# Patient Record
Sex: Female | Born: 1960 | Hispanic: No | Smoking: Never smoker
Health system: Southern US, Community
[De-identification: ages and names within clinical notes are randomized; demographics above are authoritative.]

## PROBLEM LIST (undated history)

## (undated) DIAGNOSIS — K219 Gastro-esophageal reflux disease without esophagitis: Secondary | ICD-10-CM

## (undated) DIAGNOSIS — E669 Obesity, unspecified: Secondary | ICD-10-CM

## (undated) DIAGNOSIS — R030 Elevated blood-pressure reading, without diagnosis of hypertension: Secondary | ICD-10-CM

## (undated) DIAGNOSIS — E039 Hypothyroidism, unspecified: Secondary | ICD-10-CM

## (undated) HISTORY — DX: Gastro-esophageal reflux disease without esophagitis: K21.9

## (undated) HISTORY — DX: Elevated blood-pressure reading, without diagnosis of hypertension: R03.0

---

## 1991-05-06 HISTORY — PX: TUBAL LIGATION: SHX77

## 2003-10-03 ENCOUNTER — Inpatient Hospital Stay (HOSPITAL_COMMUNITY): Admission: AD | Admit: 2003-10-03 | Discharge: 2003-10-03 | Payer: Self-pay | Admitting: *Deleted

## 2003-10-05 ENCOUNTER — Inpatient Hospital Stay (HOSPITAL_COMMUNITY): Admission: AD | Admit: 2003-10-05 | Discharge: 2003-10-05 | Payer: Self-pay | Admitting: Obstetrics & Gynecology

## 2003-11-01 ENCOUNTER — Inpatient Hospital Stay (HOSPITAL_COMMUNITY): Admission: AD | Admit: 2003-11-01 | Discharge: 2003-11-01 | Payer: Self-pay | Admitting: Obstetrics and Gynecology

## 2003-11-02 ENCOUNTER — Encounter: Admission: RE | Admit: 2003-11-02 | Discharge: 2003-11-02 | Payer: Self-pay | Admitting: Obstetrics and Gynecology

## 2003-11-07 ENCOUNTER — Ambulatory Visit (HOSPITAL_COMMUNITY): Admission: RE | Admit: 2003-11-07 | Discharge: 2003-11-07 | Payer: Self-pay | Admitting: *Deleted

## 2003-12-19 ENCOUNTER — Encounter: Admission: RE | Admit: 2003-12-19 | Discharge: 2003-12-19 | Payer: Self-pay | Admitting: Obstetrics and Gynecology

## 2009-02-05 ENCOUNTER — Emergency Department (HOSPITAL_COMMUNITY): Admission: EM | Admit: 2009-02-05 | Discharge: 2009-02-05 | Payer: Self-pay | Admitting: Emergency Medicine

## 2010-01-02 ENCOUNTER — Emergency Department (HOSPITAL_COMMUNITY): Admission: EM | Admit: 2010-01-02 | Discharge: 2010-01-02 | Payer: Self-pay | Admitting: Emergency Medicine

## 2010-04-30 ENCOUNTER — Emergency Department (HOSPITAL_BASED_OUTPATIENT_CLINIC_OR_DEPARTMENT_OTHER)
Admission: EM | Admit: 2010-04-30 | Discharge: 2010-04-30 | Payer: Self-pay | Source: Home / Self Care | Admitting: Emergency Medicine

## 2010-05-12 ENCOUNTER — Emergency Department (HOSPITAL_BASED_OUTPATIENT_CLINIC_OR_DEPARTMENT_OTHER)
Admission: EM | Admit: 2010-05-12 | Discharge: 2010-05-12 | Payer: Self-pay | Source: Home / Self Care | Admitting: Emergency Medicine

## 2010-07-19 LAB — CBC
HCT: 37.6 % (ref 36.0–46.0)
Hemoglobin: 13.1 g/dL (ref 12.0–15.0)
MCHC: 34.8 g/dL (ref 30.0–36.0)
MCV: 84.9 fL (ref 78.0–100.0)
Platelets: 202 10*3/uL (ref 150–400)
RBC: 4.43 MIL/uL (ref 3.87–5.11)
RDW: 13.8 % (ref 11.5–15.5)
WBC: 9.1 10*3/uL (ref 4.0–10.5)

## 2010-07-19 LAB — DIFFERENTIAL
Basophils Absolute: 0 10*3/uL (ref 0.0–0.1)
Basophils Relative: 0 % (ref 0–1)
Eosinophils Absolute: 0.1 10*3/uL (ref 0.0–0.7)
Eosinophils Relative: 1 % (ref 0–5)
Lymphocytes Relative: 21 % (ref 12–46)
Lymphs Abs: 1.9 10*3/uL (ref 0.7–4.0)
Monocytes Relative: 13 % — ABNORMAL HIGH (ref 3–12)
Neutrophils Relative %: 64 % (ref 43–77)

## 2010-07-19 LAB — COMPREHENSIVE METABOLIC PANEL
ALT: 18 U/L (ref 0–35)
AST: 23 U/L (ref 0–37)
Albumin: 3.7 g/dL (ref 3.5–5.2)
Alkaline Phosphatase: 75 U/L (ref 39–117)
BUN: 7 mg/dL (ref 6–23)
CO2: 28 mEq/L (ref 19–32)
Chloride: 110 mEq/L (ref 96–112)
Creatinine, Ser: 0.81 mg/dL (ref 0.4–1.2)
GFR calc Af Amer: >60 mL/min (ref >60)
GFR calc non Af Amer: >60 mL/min (ref >60)
Glucose, Bld: 96 mg/dL (ref 70–99)
Potassium: 4 mEq/L (ref 3.5–5.1)
Sodium: 142 mEq/L (ref 135–145)

## 2010-07-19 LAB — POCT CARDIAC MARKERS
CKMB, poc: <1 ng/mL — ABNORMAL LOW (ref 1.0–8.0)
Myoglobin, poc: 46.6 ng/mL (ref 12–200)
Troponin i, poc: <0.05 ng/mL (ref 0.00–0.09)

## 2010-07-19 LAB — GLUCOSE, CAPILLARY: Glucose-Capillary: 105 mg/dL — ABNORMAL HIGH (ref 70–99)

## 2013-04-15 ENCOUNTER — Ambulatory Visit: Payer: Self-pay

## 2013-05-09 ENCOUNTER — Ambulatory Visit: Payer: Self-pay

## 2013-07-12 ENCOUNTER — Ambulatory Visit: Payer: Self-pay

## 2013-07-22 ENCOUNTER — Ambulatory Visit: Payer: No Typology Code available for payment source | Attending: Internal Medicine

## 2013-08-19 ENCOUNTER — Ambulatory Visit: Payer: Self-pay | Admitting: Internal Medicine

## 2013-12-07 ENCOUNTER — Ambulatory Visit: Payer: No Typology Code available for payment source | Attending: Internal Medicine | Admitting: Internal Medicine

## 2013-12-07 ENCOUNTER — Encounter: Payer: Self-pay | Admitting: Internal Medicine

## 2013-12-07 VITALS — BP 138/90 | HR 70 | Temp 98.5°F | Resp 16 | Wt 206.0 lb

## 2013-12-07 DIAGNOSIS — S025XXK Fracture of tooth (traumatic), subsequent encounter for fracture with nonunion: Secondary | ICD-10-CM

## 2013-12-07 DIAGNOSIS — Y929 Unspecified place or not applicable: Secondary | ICD-10-CM | POA: Insufficient documentation

## 2013-12-07 DIAGNOSIS — S025XXA Fracture of tooth (traumatic), initial encounter for closed fracture: Secondary | ICD-10-CM | POA: Insufficient documentation

## 2013-12-07 DIAGNOSIS — X58XXXA Exposure to other specified factors, initial encounter: Secondary | ICD-10-CM | POA: Insufficient documentation

## 2013-12-07 DIAGNOSIS — IMO0002 Reserved for concepts with insufficient information to code with codable children: Secondary | ICD-10-CM

## 2013-12-07 DIAGNOSIS — Z9109 Other allergy status, other than to drugs and biological substances: Secondary | ICD-10-CM

## 2013-12-07 DIAGNOSIS — H0019 Chalazion unspecified eye, unspecified eyelid: Secondary | ICD-10-CM

## 2013-12-07 DIAGNOSIS — M25512 Pain in left shoulder: Secondary | ICD-10-CM | POA: Insufficient documentation

## 2013-12-07 DIAGNOSIS — Z1211 Encounter for screening for malignant neoplasm of colon: Secondary | ICD-10-CM

## 2013-12-07 DIAGNOSIS — M25519 Pain in unspecified shoulder: Secondary | ICD-10-CM

## 2013-12-07 DIAGNOSIS — K029 Dental caries, unspecified: Secondary | ICD-10-CM | POA: Insufficient documentation

## 2013-12-07 DIAGNOSIS — H0013 Chalazion right eye, unspecified eyelid: Secondary | ICD-10-CM | POA: Insufficient documentation

## 2013-12-07 DIAGNOSIS — Z139 Encounter for screening, unspecified: Secondary | ICD-10-CM

## 2013-12-07 MED ORDER — CETIRIZINE HCL 10 MG PO TABS: 10.0000 mg | ORAL_TABLET | Freq: Every day | ORAL | Status: DC

## 2013-12-07 MED ORDER — IBUPROFEN 800 MG PO TABS: 800.0000 mg | ORAL_TABLET | Freq: Three times a day (TID) | ORAL | Status: DC | PRN

## 2013-12-07 NOTE — Progress Notes (Signed)
Patient Demographics  Brandi [name], is a 53 y.o. female  BJY:[id number]  YQM:[id number]  DOB - 28-Dec-1960  CC:  Chief Complaint  Patient presents with  . Eye Pain    lump on right eye lid       HPI: Brandi Champagneetrona Luczynski is a 53 y.o. female here today to establish medical care. Patient reported to have noted in swelling and nodularity on her right eye for the last 3 months, she denies any pain or discharge, she has tried warm compress but it has not improved, denies any change in vision, she is also requesting referral to see a dentist has lot of dental cavities and broken tooth, also complaining of left on pain for several months denies any fall or trauma, she has not tried any pain medication also has lot of an omental allergies. Patient has No headache, No chest pain, No abdominal pain - No Nausea, No new weakness tingling or numbness, No Cough - SOB.  No Known Allergies History reviewed. No pertinent past medical history. No current outpatient prescriptions on file prior to visit.   No current facility-administered medications on file prior to visit.   Family History  Problem Relation Age of Onset  . Diabetes Mother   . Stroke Mother   . Hypertension Mother   . Cancer Maternal Aunt    History   Social History  . Marital Status: Married    Spouse Name: N/A    Number of Children: N/A  . Years of Education: N/A   Occupational History  . Not on file.   Social History Main Topics  . Smoking status: Never Smoker   . Smokeless tobacco: Not on file  . Alcohol Use: No  . Drug Use: Not on file  . Sexual Activity: Not on file   Other Topics Concern  . Not on file   Social History Narrative  . No narrative on file    Review of Systems: Constitutional: Negative for fever, chills, diaphoresis, activity change, appetite change and fatigue. HENT: Negative for ear pain, nosebleeds, congestion, facial swelling, rhinorrhea, neck pain, neck stiffness and ear  discharge.  Eyes: Negative for pain, discharge, redness, itching and visual disturbance. Respiratory: Negative for cough, choking, chest tightness, shortness of breath, wheezing and stridor.  Cardiovascular: Negative for chest pain, palpitations and leg swelling. Gastrointestinal: Negative for abdominal distention. Genitourinary: Negative for dysuria, urgency, frequency, hematuria, flank pain, decreased urine volume, difficulty urinating and dyspareunia.  Musculoskeletal: Negative for back pain, joint swelling, arthralgia and gait problem. Neurological: Negative for dizziness, tremors, seizures, syncope, facial asymmetry, speech difficulty, weakness, light-headedness, numbness and headaches.  Hematological: Negative for adenopathy. Does not bruise/bleed easily. Psychiatric/Behavioral: Negative for hallucinations, behavioral problems, confusion, dysphoric mood, decreased concentration and agitation.    Objective:   Filed Vitals:   12/07/13 1631  BP: 138/90  Pulse: 70  Temp: 98.5 F (36.9 C)  Resp: 16    Physical Exam: Constitutional: Patient appears well-developed and well-nourished. No distress. HENT: Normocephalic, atraumatic, External right and left ear normal. Oropharynx is clear and moist.  Eyes: Conjunctivae and EOM are normal. PERRLA, no scleral icterus. Right eye  localized swelling nontender no apparent discharge. Neck: Normal ROM. Neck supple. No JVD. No tracheal deviation. No thyromegaly. CVS: RRR, S1/S2 +, no murmurs, no gallops, no carotid bruit.  Pulmonary: Effort and breath sounds normal, no stridor, rhonchi, wheezes, rales.  Abdominal: Soft. BS +, no distension, tenderness, rebound or guarding.  Musculoskeletal: Normal range of motion. Tenderness on  the left shoulder anteriorly, some limitation in full range of motion posteriorly.  Neuro: Alert. Normal reflexes, muscle tone coordination. No cranial nerve deficit. Skin: Skin is warm and dry. No rash noted. Not  diaphoretic. No erythema. No pallor. Psychiatric: Normal mood and affect. Behavior, judgment, thought content normal.  Lab Results  Component Value Date   WBC 9.1 01/02/2010   HGB 13.1 01/02/2010   HCT 37.6 01/02/2010   MCV 84.9 01/02/2010   PLT 202 01/02/2010   Lab Results  Component Value Date   CREATININE 0.81 01/02/2010   BUN 7 01/02/2010   NA 142 01/02/2010   K 4.0 01/02/2010   CL 110 01/02/2010   CO2 28 01/02/2010    No results found for this basename: HGBA1C   Lipid Panel  No results found for this basename: chol, trig, hdl, cholhdl, vldl, ldlcalc       Assessment and plan:   1. Broken tooth, with nonunion, subsequent encounter  - Ambulatory referral to Dentistry  2. Chalazion of right eye Patient is given information and she will continue with warm compress since it has been persistent for the last 3 months I have given her referral to see ophthalmology. - Ambulatory referral to Ophthalmology  3. Screening We'll do baseline blood work - CBC with Differential - TSH - Lipid panel - Vit D  25 hydroxy (rtn osteoporosis monitoring) - COMPLETE METABOLIC PANEL WITH GFR - MM DIGITAL SCREENING BILATERAL; Future  4. Special screening for malignant neoplasms, colon  - Ambulatory referral to Gastroenterology  5. Left shoulder pain I ordered an x-ray and prescribed ibuprofen when necessary - ibuprofen (ADVIL,MOTRIN) 800 MG tablet; Take 1 tablet (800 mg total) by mouth every 8 (eight) hours as needed.  Dispense: 30 tablet; Refill: 1 - DG Shoulder Left; Future  6. Environmental allergies  - cetirizine (ZYRTEC) 10 MG tablet; Take 1 tablet (10 mg total) by mouth daily.  Dispense: 30 tablet; Refill: 3        Health Maintenance -Colonoscopy: referred to GI  -Mammogram: ordered    Return in about 3 months (around 03/09/2014).    Doris Cheadle, MD

## 2013-12-07 NOTE — Progress Notes (Signed)
Patient here with in house interpreter States has not been to see a doctor in a very long time Concerned about small lump on her right eye lid that has Been there since April of this year Patient will also need a referral to the dentist

## 2013-12-07 NOTE — Patient Instructions (Addendum)
Chalazin  (Chalazion) Un chalazin es una hinchazn o bulto duro en el prpado originado en la obstruccin de una glndula sebcea. Puede ocurrir en el prpado superior o en el inferior.  CAUSAS  Obstruccin de una glndula sebcea del prpado.  SNTOMAS   Hinchazn o bulto duro en el prpado. El bulto puede dificultarle la visin.  La hinchazn puede extenderse a zonas que rodean el ojo. TRATAMIENTO   Aunque en algunos casos desaparecen por s mismos en 1  2 meses, en otros casos deben extirparse.  Podr necesitar medicamentos para tratar la infeccin. INSTRUCCIONES PARA EL CUIDADO EN EL HOGAR   Lave sus manos con frecuencia y squelas con una toalla limpia. No toque el chalazin.  Aplique calor sobre el prpado varias veces por da durante 10 minutos para calmar las molestias y Air traffic controllerfavorecer la salida del lquido blanco amarillento (pus) a la superficie. Una forma de Corporate treasureraplicar calor es usar una cuchara de metal.  Sostenga el mango debajo del agua caliente hasta que tome calor y luego envulvalo en toallas de papel, de modo que el calor llegue sin quemar la piel.  Sostenga el mango envuelto en papel contra el chalazin y vuelva a calentarlo cuando lo necesite.  Aplique calor durante 10 minutos, 4 veces por da.  Concurra al mdico para que le retire el pus, si no se abre ruptura  por s mismo.  No trate de extraer usted mismo el pus apretando el chalazin o pinchndolo con un alfiler o una aguja.  Slo tome medicamentos de venta libre o recetados para Primary school teachercalmar el dolor, las molestias o bajar la fiebre segn las indicaciones de su mdico. SOLICITE ATENCIN MDICA DE INMEDIATO SI:   Siente dolor en el abdomen.  Hay cambios en la visin.  El dolor persiste.  El chalazin se torna doloroso, rojo o hinchado, se agranda y no empieza a Journalist, newspaperdesaparecer despus de 2 semanas. ASEGRESE DE QUE:   Comprende estas instrucciones.  Controlar su enfermedad.  Solicitar ayuda de inmediato si no  mejora o si empeora. Document Released: 04/21/2005 Document Revised: 10/21/2011 [facility] Patient Information 2015 [employer], [place]. This information is not intended to replace advice given to you by your health care provider. Make sure you discuss any questions you have with your health care provider.

## 2013-12-08 LAB — CBC WITH DIFFERENTIAL/PLATELET
Basophils Absolute: 0 10*3/uL (ref 0.0–0.1)
Basophils Relative: 0 % (ref 0–1)
EOS PCT: 1 % (ref 0–5)
Eosinophils Absolute: 0.1 10*3/uL (ref 0.0–0.7)
HCT: 39.3 % (ref 36.0–46.0)
Hemoglobin: 12.9 g/dL (ref 12.0–15.0)
LYMPHS ABS: 1.8 10*3/uL (ref 0.7–4.0)
Lymphocytes Relative: 19 % (ref 12–46)
MCH: 28.2 pg (ref 26.0–34.0)
MCHC: 32.8 g/dL (ref 30.0–36.0)
MCV: 86 fL (ref 78.0–100.0)
Monocytes Absolute: 1.5 10*3/uL — ABNORMAL HIGH (ref 0.1–1.0)
Monocytes Relative: 16 % — ABNORMAL HIGH (ref 3–12)
Neutro Abs: 6 10*3/uL (ref 1.7–7.7)
Neutrophils Relative %: 64 % (ref 43–77)
Platelets: 274 10*3/uL (ref 150–400)
RBC: 4.57 MIL/uL (ref 3.87–5.11)
RDW: 14.6 % (ref 11.5–15.5)
WBC: 9.4 10*3/uL (ref 4.0–10.5)

## 2013-12-08 LAB — COMPLETE METABOLIC PANEL WITH GFR
ALT: 25 U/L (ref 0–35)
AST: 24 U/L (ref 0–37)
Albumin: 4.2 g/dL (ref 3.5–5.2)
Alkaline Phosphatase: 91 U/L (ref 39–117)
BUN: 9 mg/dL (ref 6–23)
CO2: 26 mEq/L (ref 19–32)
CREATININE: 0.7 mg/dL (ref 0.50–1.10)
Calcium: 9.5 mg/dL (ref 8.4–10.5)
Chloride: 106 mEq/L (ref 96–112)
GFR, Est African American: >89 mL/min
GFR, Est Non African American: >89 mL/min
Glucose, Bld: 81 mg/dL (ref 70–99)
Potassium: 4.4 mEq/L (ref 3.5–5.3)
Sodium: 139 mEq/L (ref 135–145)
Total Bilirubin: 0.6 mg/dL (ref 0.2–1.2)
Total Protein: 8 g/dL (ref 6.0–8.3)

## 2013-12-08 LAB — LIPID PANEL
Cholesterol: 170 mg/dL (ref 0–200)
HDL: 42 mg/dL (ref >39)
LDL Cholesterol: 54 mg/dL (ref 0–99)
Total CHOL/HDL Ratio: 4 Ratio
Triglycerides: 371 mg/dL — ABNORMAL HIGH (ref <150)
VLDL: 74 mg/dL — ABNORMAL HIGH (ref 0–40)

## 2013-12-08 LAB — TSH: TSH: 3.481 u[IU]/mL (ref 0.350–4.500)

## 2013-12-08 LAB — VITAMIN D 25 HYDROXY (VIT D DEFICIENCY, FRACTURES): Vit D, 25-Hydroxy: 33 ng/mL (ref 30–89)

## 2013-12-09 ENCOUNTER — Telehealth: Payer: Self-pay | Admitting: *Deleted

## 2013-12-09 NOTE — Telephone Encounter (Signed)
Pt is aware of her lab results.  

## 2013-12-09 NOTE — Telephone Encounter (Signed)
Message copied by Raynelle CharyWINFREE, Casy Tavano R on Fri Dec 09, 2013  9:33 AM ------      Message from: Doris CheadleADVANI, DEEPAK      Created: Thu Dec 08, 2013 10:02 AM       Blood work reviewed, noticed elevated triglycerides, advise patient for low fat diet.        ------

## 2013-12-20 ENCOUNTER — Telehealth: Payer: Self-pay

## 2013-12-20 NOTE — Telephone Encounter (Signed)
Message copied by Lestine MountJUAREZ, Kasen Adduci L on Tue Dec 20, 2013  2:28 PM ------      Message from: Doris CheadleADVANI, DEEPAK      Created: Thu Dec 08, 2013 10:02 AM       Blood work reviewed, noticed elevated triglycerides, advise patient for low fat diet.        ------

## 2015-02-20 ENCOUNTER — Ambulatory Visit: Payer: Self-pay

## 2015-02-28 ENCOUNTER — Encounter (HOSPITAL_COMMUNITY): Payer: Self-pay | Admitting: *Deleted

## 2015-02-28 ENCOUNTER — Emergency Department (HOSPITAL_COMMUNITY)
Admission: EM | Admit: 2015-02-28 | Discharge: 2015-02-28 | Disposition: A | Discharge status: Home / Self Care | Payer: Self-pay | Attending: Physician Assistant | Admitting: Physician Assistant

## 2015-02-28 DIAGNOSIS — Z3202 Encounter for pregnancy test, result negative: Secondary | ICD-10-CM | POA: Insufficient documentation

## 2015-02-28 DIAGNOSIS — K2970 Gastritis, unspecified, without bleeding: Secondary | ICD-10-CM

## 2015-02-28 DIAGNOSIS — E669 Obesity, unspecified: Secondary | ICD-10-CM | POA: Insufficient documentation

## 2015-02-28 HISTORY — DX: Obesity, unspecified: E66.9

## 2015-02-28 LAB — URINALYSIS, ROUTINE W REFLEX MICROSCOPIC
BILIRUBIN URINE: NEGATIVE
Glucose, UA: NEGATIVE mg/dL
Hgb urine dipstick: NEGATIVE
Ketones, ur: NEGATIVE mg/dL
LEUKOCYTES UA: NEGATIVE
NITRITE: NEGATIVE
Protein, ur: NEGATIVE mg/dL
Specific Gravity, Urine: 1.012 (ref 1.005–1.030)
Urobilinogen, UA: 1 mg/dL (ref 0.0–1.0)
pH: 6 (ref 5.0–8.0)

## 2015-02-28 LAB — COMPREHENSIVE METABOLIC PANEL
ALT: 33 U/L (ref 14–54)
AST: 34 U/L (ref 15–41)
Albumin: 3.3 g/dL — ABNORMAL LOW (ref 3.5–5.0)
Alkaline Phosphatase: 99 U/L (ref 38–126)
Anion gap: 7 (ref 5–15)
BUN: 8 mg/dL (ref 6–20)
CHLORIDE: 104 mmol/L (ref 101–111)
CO2: 25 mmol/L (ref 22–32)
CREATININE: 0.72 mg/dL (ref 0.44–1.00)
Calcium: 8.6 mg/dL — ABNORMAL LOW (ref 8.9–10.3)
GFR calc Af Amer: >60 mL/min (ref >60)
Glucose, Bld: 100 mg/dL — ABNORMAL HIGH (ref 65–99)
Potassium: 4 mmol/L (ref 3.5–5.1)
Sodium: 136 mmol/L (ref 135–145)
TOTAL PROTEIN: 7.4 g/dL (ref 6.5–8.1)
Total Bilirubin: 0.6 mg/dL (ref 0.3–1.2)

## 2015-02-28 LAB — CBC
HCT: 36.4 % (ref 36.0–46.0)
Hemoglobin: 12.6 g/dL (ref 12.0–15.0)
MCH: 28.9 pg (ref 26.0–34.0)
MCHC: 34.6 g/dL (ref 30.0–36.0)
MCV: 83.5 fL (ref 78.0–100.0)
Platelets: 222 10*3/uL (ref 150–400)
RBC: 4.36 MIL/uL (ref 3.87–5.11)
RDW: 13.2 % (ref 11.5–15.5)
WBC: 9.5 10*3/uL (ref 4.0–10.5)

## 2015-02-28 LAB — I-STAT BETA HCG BLOOD, ED (MC, WL, AP ONLY): I-stat hCG, quantitative: <5 m[IU]/mL (ref <5)

## 2015-02-28 LAB — LIPASE, BLOOD: LIPASE: 27 U/L (ref 11–51)

## 2015-02-28 LAB — POC URINE PREG, ED: Preg Test, Ur: NEGATIVE

## 2015-02-28 MED ORDER — PANTOPRAZOLE SODIUM 40 MG PO TBEC
40.0000 mg | DELAYED_RELEASE_TABLET | Freq: Once | ORAL | Status: AC
  Administered 2015-02-28: 40 mg via ORAL
  Filled 2015-02-28: qty 1

## 2015-02-28 MED ORDER — GI COCKTAIL ~~LOC~~
30.0000 mL | Freq: Once | ORAL | Status: AC
  Administered 2015-02-28: 30 mL via ORAL
  Filled 2015-02-28: qty 30

## 2015-02-28 MED ORDER — OMEPRAZOLE 20 MG PO CPDR: 20.0000 mg | DELAYED_RELEASE_CAPSULE | Freq: Every day | ORAL | Status: DC

## 2015-02-28 NOTE — ED Notes (Signed)
Used translator phone, pt reports fever/chills since Saturday, initially had diarrhea which has since stopped. Unable to eat due to pain occuring after she eats.

## 2015-02-28 NOTE — Discharge Instructions (Signed)
Gastritis - Adultos   (Gastritis, Adult)   Gastrittis es la hinchazón e irritación (inflamación) del revestimiento interno del estómago. Si no recibe tratamiento, la gastritis puede causar sangrado y llagas.(úlceras) en el estómago.  CUIDADOS EN EL HOGAR   · Sólo tome los medicamentos según le indique el médico.  · Si le han recetado antibióticos, tómelos según las indicaciones. Termine de tomar el medicamento, aunque comience a sentirse mejor.  · Beba gran cantidad de líquido para mantener el pis (orina) de tono claro o amarillo pálido.  · Evite las comidas y bebidas que empeoran los problemas. Los alimentos que debe evitar son:  ¨ Cafeína y alcohol.  ¨ Chocolate.  ¨ Menta.  ¨ Ajo y cebolla.  ¨ Comidas muy condimentadas.  ¨ Cítricos como naranjas, limones o limas.  ¨ Alimentos que contengan tomate, como salsas, chile y pizza.  ¨ Alimentos fritos y grasos.  · Haga comidas pequeñas durante el día en lugar de 3 comidas abundantes.  SOLICITE AYUDA DE INMEDIATO SI:   · La materia fecal (heces)es negra o de color rojo oscuro.  · Vomita sangre. Puede ser similar a la borra del café  · No puede retener los líquidos.  · El dolor en el vientre (abdominal) empeora.  · Tiene fiebre.  · No mejora luego de 1 semana.  · Tiene preguntas o preocupaciones.  ASEGÚRESE DE QUE:   · Comprende estas instrucciones.  · Controlará su enfermedad.  · Solicitará ayuda de inmediato si no mejora o si empeora.     Esta información no tiene como fin reemplazar el consejo del médico. Asegúrese de hacerle al médico cualquier pregunta que tenga.     Document Released: 10/21/2011  Elsevier Interactive Patient Education ©2016 Elsevier Inc.

## 2015-02-28 NOTE — ED Provider Notes (Signed)
CSN: [id number]     Arrival date & time 02/28/15  1813 History   First MD Initiated Contact with Patient 02/28/15 1936     Chief Complaint  Patient presents with  . Abdominal Pain     (Consider location/radiation/quality/duration/timing/severity/associated sxs/prior Treatment) HPI   Patient is a 54 year old Spanish female presenting today with epigastric pain. Patient had diarrhea last Saturday. The diarrhea stopped last Saturday and she does not have any since then. On last Saturday she had a mild fever she reports however since then she has not had any. She's had no vomiting. Patient has mild epigastric pain. She says is not made worse or better by eating. She reports mild gas feeling.  Past Medical History  Diagnosis Date  . Obesity    History reviewed. No pertinent past surgical history. History reviewed. No pertinent family history. Social History  Substance Use Topics  . Smoking status: Never Smoker   . Smokeless tobacco: None  . Alcohol Use: No   OB History    No data available     Review of Systems  Constitutional: Positive for fatigue. Negative for activity change.  HENT: Negative for congestion.   Eyes: Negative for discharge.  Respiratory: Negative for cough and chest tightness.   Cardiovascular: Negative for chest pain.  Gastrointestinal: Positive for nausea, abdominal pain and diarrhea. Negative for vomiting, blood in stool, abdominal distention and anal bleeding.  Genitourinary: Negative for dysuria and difficulty urinating.  Musculoskeletal: Negative for joint swelling.  Skin: Negative for rash.  Psychiatric/Behavioral: Negative for confusion.      Allergies  Review of patient's allergies indicates no known allergies.  Home Medications   Prior to Admission medications   Not on File   BP 169/98 mmHg  Pulse 78  Temp(Src) 98.2 F (36.8 C) (Oral)  Resp 20  SpO2 99% Physical Exam  Constitutional: She is oriented to person, place, and time. She  appears well-developed and well-nourished.  HENT:  Head: Normocephalic and atraumatic.  Eyes: Conjunctivae are normal. Right eye exhibits no discharge.  Neck: Neck supple.  Cardiovascular: Normal rate, regular rhythm and normal heart sounds.   No murmur heard. Pulmonary/Chest: Effort normal and breath sounds normal. She has no wheezes. She has no rales.  Abdominal: Soft. She exhibits no distension. There is no tenderness.  No tenderness on exam.  Musculoskeletal: Normal range of motion. She exhibits no edema.  Neurological: She is oriented to person, place, and time. No cranial nerve deficit.  Skin: Skin is warm and dry. No rash noted. She is not diaphoretic.  Psychiatric: She has a normal mood and affect. Her behavior is normal.  Nursing note and vitals reviewed.   ED Course  Procedures (including critical care time) Labs Review Labs Reviewed  COMPREHENSIVE METABOLIC PANEL - Abnormal; Notable for the following:    Glucose, Bld 100 (*)    Calcium 8.6 (*)    Albumin 3.3 (*)    All other components within normal limits  LIPASE, BLOOD  CBC  URINALYSIS, ROUTINE W REFLEX MICROSCOPIC (NOT AT [facility])  I-STAT BETA HCG BLOOD, ED ([facility], [facility], [facility] ONLY)    Imaging Review No results found. I have personally reviewed and evaluated these images and lab results as part of my medical decision-making.   EKG Interpretation None      MDM   Final diagnoses:  None   patient is a pleasant 54 year old Hispanic female presenting today with epigastric pain. Patient states she had diarrhea last Saturday has not had any  since then. No vomiting. No fevers. Patient states she has difficulty telling whether it's worse or better with eating. Concern for post viral gastroenteritis gastritis. She reports increase in burping. We'll treat with GI cocktail and Prilosec. We'll give her Prilosec to take at home. Patient eating taking normally. Normal vital signs. Appears well on exam.     Randall AnLyn ,  MD 02/28/15 2105

## 2015-03-13 ENCOUNTER — Encounter: Payer: Self-pay | Admitting: Internal Medicine

## 2016-05-05 HISTORY — PX: OTHER SURGICAL HISTORY: SHX169

## 2016-05-21 ENCOUNTER — Ambulatory Visit: Payer: Self-pay | Admitting: Family Medicine

## 2016-06-05 ENCOUNTER — Ambulatory Visit: Payer: Self-pay | Admitting: Family Medicine

## 2016-06-09 ENCOUNTER — Ambulatory Visit: Payer: Self-pay | Attending: Family Medicine | Admitting: Family Medicine

## 2016-06-09 ENCOUNTER — Encounter: Payer: Self-pay | Admitting: Family Medicine

## 2016-06-09 VITALS — BP 131/83 | HR 83 | Temp 98.1°F | Resp 18 | Ht <= 58 in | Wt 196.6 lb

## 2016-06-09 DIAGNOSIS — G5603 Carpal tunnel syndrome, bilateral upper limbs: Secondary | ICD-10-CM | POA: Insufficient documentation

## 2016-06-09 DIAGNOSIS — M25531 Pain in right wrist: Secondary | ICD-10-CM | POA: Insufficient documentation

## 2016-06-09 DIAGNOSIS — K029 Dental caries, unspecified: Secondary | ICD-10-CM | POA: Insufficient documentation

## 2016-06-09 DIAGNOSIS — H547 Unspecified visual loss: Secondary | ICD-10-CM

## 2016-06-09 DIAGNOSIS — Z79899 Other long term (current) drug therapy: Secondary | ICD-10-CM | POA: Insufficient documentation

## 2016-06-09 DIAGNOSIS — M25532 Pain in left wrist: Secondary | ICD-10-CM | POA: Insufficient documentation

## 2016-06-09 DIAGNOSIS — M25512 Pain in left shoulder: Secondary | ICD-10-CM

## 2016-06-09 DIAGNOSIS — R200 Anesthesia of skin: Secondary | ICD-10-CM | POA: Insufficient documentation

## 2016-06-09 DIAGNOSIS — G8929 Other chronic pain: Secondary | ICD-10-CM | POA: Insufficient documentation

## 2016-06-09 DIAGNOSIS — S4992XS Unspecified injury of left shoulder and upper arm, sequela: Secondary | ICD-10-CM

## 2016-06-09 DIAGNOSIS — M25511 Pain in right shoulder: Secondary | ICD-10-CM

## 2016-06-09 DIAGNOSIS — M2550 Pain in unspecified joint: Secondary | ICD-10-CM

## 2016-06-09 MED ORDER — NAPROXEN 500 MG PO TABS: 500.0000 mg | ORAL_TABLET | Freq: Two times a day (BID) | ORAL | 0 refills | Status: DC

## 2016-06-09 MED ORDER — WRIST SPLINT LEFT/RIGHT MISC: 1.0000 | Freq: Once | 0 refills | Status: AC

## 2016-06-09 NOTE — Progress Notes (Signed)
Patient is here to establish care.  Patient denies pain at this time.  Patient has not taken medication today. Patient has eaten today.  Patient declined the flu vaccine.

## 2016-06-09 NOTE — Patient Instructions (Signed)
Sndrome del tnel carpiano (Carpal Tunnel Syndrome) El sndrome del tnel carpiano es una afeccin que causa dolor en la mano y en el brazo. El tnel carpiano es un espacio estrecho ubicado en el lado palmar de la [place]. Los movimientos repetidos de la mueca o determinadas enfermedades pueden causar la hinchazn del tnel. Esta hinchazn puede comprimir el nervio principal de la mueca (nervio mediano). CUIDADOS EN EL HOGAR Si tiene una frula:   sela como se lo haya indicado el mdico. Qutesela solamente como se lo haya indicado el mdico.  Afloje la frula si los dedos:  Se le adormecen y siente hormigueos.  Se le ponen azulados y fros.  Mantenga la frula limpia y seca. Instrucciones generales   Baxter Internationalome los medicamentos de venta libre y los recetados solamente como se lo haya indicado el mdico.  Haga reposar la [place] de toda actividad que le cause dolor. Si es necesario, hable con su empleador United Stationerssobre los cambios que pueden hacerse en su lugar de [place], por ejemplo, usar una almohadilla para apoyar la mueca mientras tipea.  Si se lo indican, aplique hielo sobre la zona dolorida:  Ponga el hielo en una bolsa plstica.  Coloque una toalla entre la piel y la bolsa de hielo.  Coloque el hielo durante 20minutos, 2 a 3veces por Futures traderda.  Concurra a todas las visitas de control como se lo haya indicado el mdico. Esto es importante.  Haga los ejercicios como se lo hayan indicado el mdico, el fisioterapeuta o el terapeuta ocupacional. SOLICITE AYUDA SI:  Aparecen nuevos sntomas.  Los medicamentos no Tourist information centre managerle alivian el dolor.  Los sntomas empeoran. Esta informacin no tiene Theme park managercomo fin reemplazar el consejo del mdico. Asegrese de hacerle al mdico cualquier pregunta que tenga. Document Released: 04/10/2011 Document Revised: 01/10/2015 Document Reviewed: 09/06/2014 Elsevier Interactive Patient Education  2017 [employer]vier Inc.
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
## 2016-06-10 LAB — RHEUMATOID FACTOR: Rheumatoid fact SerPl-aCnc: <14 IU/mL (ref <14)

## 2016-06-10 MED FILL — NAPROXEN 500 MG TABLET: 500 | 30 days supply | Qty: 60 | Fill #0

## 2016-06-11 LAB — ANA, IFA COMPREHENSIVE PANEL
ANA: NEGATIVE
DS DNA AB: 8 [IU]/mL — AB
ENA SM Ab Ser-aCnc: <1
SCLERODERMA (SCL-70) (ENA) ANTIBODY, IGG: NEGATIVE
SM/RNP: <1
SSA (Ro) (ENA) Antibody, IgG: <1
SSB (La) (ENA) Antibody, IgG: <1

## 2016-06-12 ENCOUNTER — Telehealth: Payer: Self-pay

## 2016-06-12 NOTE — Telephone Encounter (Signed)
-----   Message from Lizbeth BarkMandesia R Hairston, OregonFNP sent at 06/12/2016  8:24 AM EST ----- -Labs used to screen for autoimmune disorders that can cause inflammation of the joints like rheumatoid arthritis or lupus are negative.

## 2016-06-12 NOTE — Telephone Encounter (Signed)
CMA call to go over lab results  Patient did not answer  Patient VM has not been set up yet

## 2016-06-17 ENCOUNTER — Ambulatory Visit (HOSPITAL_COMMUNITY)
Admission: RE | Admit: 2016-06-17 | Discharge: 2016-06-17 | Disposition: A | Discharge status: Home / Self Care | Payer: Self-pay | Source: Ambulatory Visit | Attending: Family Medicine | Admitting: Family Medicine

## 2016-06-17 DIAGNOSIS — S4992XS Unspecified injury of left shoulder and upper arm, sequela: Secondary | ICD-10-CM

## 2016-06-17 DIAGNOSIS — M25712 Osteophyte, left shoulder: Secondary | ICD-10-CM | POA: Insufficient documentation

## 2016-06-17 DIAGNOSIS — M19012 Primary osteoarthritis, left shoulder: Secondary | ICD-10-CM | POA: Insufficient documentation

## 2016-06-20 ENCOUNTER — Telehealth: Payer: Self-pay

## 2016-06-20 ENCOUNTER — Other Ambulatory Visit: Payer: Self-pay | Admitting: Family Medicine

## 2016-06-20 DIAGNOSIS — M159 Polyosteoarthritis, unspecified: Secondary | ICD-10-CM

## 2016-06-20 NOTE — Telephone Encounter (Signed)
CAM call to inform x ray results  Patient Verify DOB  Patient was aware and understood the results

## 2016-06-20 NOTE — Telephone Encounter (Signed)
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 You will be referred to orthopedics.

## 2016-07-24 ENCOUNTER — Ambulatory Visit: Payer: Self-pay | Attending: Family Medicine

## 2016-09-05 ENCOUNTER — Emergency Department (HOSPITAL_COMMUNITY)
Admission: EM | Admit: 2016-09-05 | Discharge: 2016-09-05 | Disposition: A | Discharge status: Home / Self Care | Payer: Worker's Compensation | Attending: Emergency Medicine | Admitting: Emergency Medicine

## 2016-09-05 ENCOUNTER — Emergency Department (HOSPITAL_COMMUNITY): Payer: Worker's Compensation

## 2016-09-05 ENCOUNTER — Encounter (HOSPITAL_COMMUNITY): Payer: Self-pay | Admitting: *Deleted

## 2016-09-05 DIAGNOSIS — Y9389 Activity, other specified: Secondary | ICD-10-CM | POA: Insufficient documentation

## 2016-09-05 DIAGNOSIS — Y990 Civilian activity done for income or pay: Secondary | ICD-10-CM | POA: Insufficient documentation

## 2016-09-05 DIAGNOSIS — S8391XA Sprain of unspecified site of right knee, initial encounter: Secondary | ICD-10-CM

## 2016-09-05 DIAGNOSIS — W208XXA Other cause of strike by thrown, projected or falling object, initial encounter: Secondary | ICD-10-CM | POA: Insufficient documentation

## 2016-09-05 DIAGNOSIS — Y929 Unspecified place or not applicable: Secondary | ICD-10-CM | POA: Insufficient documentation

## 2016-09-05 DIAGNOSIS — Z79899 Other long term (current) drug therapy: Secondary | ICD-10-CM | POA: Insufficient documentation

## 2016-09-05 MED ORDER — HYDROCODONE-ACETAMINOPHEN 5-325 MG PO TABS: 2.0000 | ORAL_TABLET | ORAL | 0 refills | Status: DC | PRN

## 2016-09-05 NOTE — Discharge Instructions (Signed)
Follow-up with referred orthopedic doctor for further evaluation if symptoms do not improve.  Take pain medication as directed for pain.  Return to the emergency department for any worsening pain, fever, redness, swelling, any other worsening or concerning symptoms.

## 2016-09-05 NOTE — Progress Notes (Signed)
Orthopedic Tech Progress Note Patient Details:  Kerrin Champagneetrona Osgood 10-05-1960 [id number]  Ortho Devices Type of Ortho Device: Knee Immobilizer, Crutches Ortho Device/Splint Location: applied knee immobilzer and crutches to pt right leg.  pt tolerated knee immbilizer and ambulated very well.  right leg/knee.  Ortho Device/Splint Interventions: Application, Adjustment   Alvina ChouWilliams, Myley Bahner C 09/05/2016, 4:46 PM

## 2016-09-05 NOTE — ED Provider Notes (Signed)
[facility] Provider Note   CSN: [id number] Arrival date & time: 09/05/16  1319  By signing my name below, I, Linna DarnerRussell Turner, attest that this documentation has been prepared under the direction and in the presence of Graciella FreerLindsey Layden, PA-C. Electronically Signed: Linna Darnerussell Turner, Scribe. 09/05/2016. 3:02 PM.  History   Chief Complaint Chief Complaint  Patient presents with  . Knee Pain   The history is provided by the patient. A language interpreter was used.    HPI Comments: Brandi [name] is a 56 y.o. female with no chronic medical problems who presents to the Emergency Department complaining of sudden onset, constant, medial right knee pain beginning yesterday while at work. She states she lifted a moderately heavy stick, dropped it, and it struck her in the right knee. No falls or injury. Patient endorses pain exacerbation with weight bearing and is ambulatory with difficulty secondary to pain. She has tried ibuprofen and applied ice to her right knee without any improvement of her pain. She denies joint swelling, numbness/tingling, focal weakness, fevers, chills, color change, wounds, or any other associated symptoms.  Past Medical History:  Diagnosis Date  . Obesity     Patient Active Problem List   Diagnosis Date Noted  . Chalazion of right eye 12/07/2013  . Left shoulder pain 12/07/2013  . Environmental allergies 12/07/2013    Past Surgical History:  Procedure Laterality Date  . CESAREAN SECTION      OB History    Gravida Para Term Preterm AB Living   0 0 0 0 0     SAB TAB Ectopic Multiple Live Births   0 0 0           Home Medications    Prior to Admission medications   Medication Sig Start Date End Date Taking? Authorizing Provider  cetirizine (ZYRTEC) 10 MG tablet Take 1 tablet (10 mg total) by mouth daily. Patient not taking: Reported on 06/09/2016 12/07/13   Doris Cheadleeepak Advani, MD  diphenhydrAMINE (SOMINEX) 25 MG tablet Take 25 mg by mouth at bedtime as  needed for sleep.    Historical Provider, MD  diphenhydrAMINE (SOMINEX) 25 MG tablet Take 25 mg by mouth at bedtime as needed for itching or sleep.    Historical Provider, MD  HYDROcodone-acetaminophen (NORCO/VICODIN) 5-325 MG tablet Take 2 tablets by mouth every 4 (four) hours as needed. 09/05/16   Maxwell CaulLindsey A Layden, PA-C  ibuprofen (ADVIL,MOTRIN) 200 MG tablet Take 200 mg by mouth every 6 (six) hours as needed for fever.    Historical Provider, MD  ibuprofen (ADVIL,MOTRIN) 800 MG tablet Take 1 tablet (800 mg total) by mouth every 8 (eight) hours as needed. Patient not taking: Reported on 06/09/2016 12/07/13   Doris Cheadleeepak Advani, MD  naproxen (NAPROSYN) 500 MG tablet Take 1 tablet (500 mg total) by mouth 2 (two) times daily with a meal. 06/09/16   Lizbeth BarkMandesia R Hairston, FNP  omeprazole (PRILOSEC) 20 MG capsule Take 1 capsule (20 mg total) by mouth daily. Patient not taking: Reported on 06/09/2016 02/28/15   Courteney Lyn Mackuen, MD    Family History Family History  Problem Relation Age of Onset  . Diabetes Mother   . Stroke Mother   . Hypertension Mother   . Cancer Maternal Aunt     Social History Social History  Substance Use Topics  . Smoking status: Never Smoker  . Smokeless tobacco: Never Used  . Alcohol use No     Allergies   Patient has no known allergies.  Review of Systems Review of Systems  Constitutional: Negative for chills and fever.  Musculoskeletal: Positive for arthralgias and gait problem. Negative for joint swelling.  Skin: Negative for color change and wound.  Neurological: Negative for weakness and numbness.  All other systems reviewed and are negative.  Physical Exam Updated Vital Signs BP (!) 108/56 (BP Location: Right Arm)   Pulse 65   Temp 98 F (36.7 C) (Oral)   Resp 18   SpO2 98%   Physical Exam  Constitutional: She appears well-developed and well-nourished.  HENT:  Head: Normocephalic and atraumatic.  Eyes: Conjunctivae and EOM are normal. Right eye  exhibits no discharge. Left eye exhibits no discharge. No scleral icterus.  Cardiovascular:  Pulses:      Dorsalis pedis pulses are 2+ on the right side, and 2+ on the left side.  Pulmonary/Chest: Effort normal.  Musculoskeletal: She exhibits tenderness. She exhibits no deformity.  Right knee: No soft tissue swelling, ecchymosis, warmth, or erythema. No evidence of effusion. Tenderness to the medial aspect of the knee. Good flexion and extension of RLE. No instability felt on anterior and posterior drawer tests. No valgus or varus instability.  Neurological: She is alert.  Skin: Skin is warm and dry.  Psychiatric: She has a normal mood and affect. Her speech is normal and behavior is normal.   ED Treatments / Results  Labs (all labs ordered are listed, but only abnormal results are displayed) Labs Reviewed - No data to display  EKG  EKG Interpretation None       Radiology Dg Knee Complete 4 Views Right  Result Date: 09/05/2016 CLINICAL DATA:  Right knee injury at work yesterday with pain. EXAM: RIGHT KNEE - COMPLETE 4+ VIEW COMPARISON:  None. FINDINGS: No evidence of fracture, dislocation, or joint effusion. Mild degenerative marginal spurring about the medial and patellofemoral compartments. 11 mm ossified body in the posterior joint line compatible with articular body. IMPRESSION: 1. No acute finding. 2. Mild degenerative spurring with posterior articular body. Electronically Signed   By: Marnee Spring M.D.   On: 09/05/2016 14:42    Procedures Procedures (including critical care time)  DIAGNOSTIC STUDIES: Oxygen Saturation is 99% on RA, normal by my interpretation.    COORDINATION OF CARE: 3:19 PM Discussed treatment plan with pt at bedside and pt agreed to plan.  Medications Ordered in ED Medications - No data to display   Initial Impression / Assessment and Plan / ED Course  I have reviewed the triage vital signs and the nursing notes.  Pertinent labs & imaging  results that were available during my care of the patient were reviewed by me and considered in my medical decision making (see chart for details).     56 year old female who presents with right knee pain that began yesterday at work. Patient reports that she dropped a large stick on her knee and has pain since. She was initially evaluated by the doctor at her work where she had x-rays or negative for any fracture. She's been taking a breath of her pain with temporary relief. She comes emergency today for worsening and persistent pain, that is worse with ambulation and bearing weight. Physical exam with tenderness to the medial aspect of the right knee. No surrounding soft tissue swelling, warmth, erythema. She is able to flex and extend the leg without difficulty. No instability felt on anterior posterior drawer test or valgus/varus stress. History/physical exam are not concerning for septic joint. Consider fracture versus sprain. X-rays ordered at  triage.  X-rays reviewed and negative for any acute fracture or dislocation. Discussed results with patient using an interpreter. Explained that x-rays only show bony abnormalities and may not show a ligamentous or muscular injury. We will plan to apply a knee immobilizer and crutches in the department. Will provide patient with orthopedic referral that she can follow-up within the next few days. Strict return precautions given. Patient compresses understanding and agreement with plan.   Final Clinical Impressions(s) / ED Diagnoses   Final diagnoses:  Sprain of right knee, unspecified ligament, initial encounter    New Prescriptions Discharge Medication List as of 09/05/2016  3:45 PM    START taking these medications   Details  HYDROcodone-acetaminophen (NORCO/VICODIN) 5-325 MG tablet Take 2 tablets by mouth every 4 (four) hours as needed., Starting Fri 09/05/2016, Print       I personally performed the services described in this documentation, which  was scribed in my presence. The recorded information has been reviewed and is accurate.    Maxwell Caul, PA-C 09/06/16 1633    Nira Conn, MD 09/09/16 [phone]

## 2016-09-05 NOTE — ED Triage Notes (Signed)
Pt reports injuring right knee while at work yesterday and still having pain.

## 2016-12-12 ENCOUNTER — Encounter (HOSPITAL_COMMUNITY): Payer: Self-pay | Admitting: *Deleted

## 2016-12-12 ENCOUNTER — Ambulatory Visit (HOSPITAL_COMMUNITY)
Admission: EM | Admit: 2016-12-12 | Discharge: 2016-12-12 | Disposition: A | Discharge status: Home / Self Care | Payer: Self-pay | Attending: Emergency Medicine | Admitting: Emergency Medicine

## 2016-12-12 DIAGNOSIS — M549 Dorsalgia, unspecified: Secondary | ICD-10-CM

## 2016-12-12 DIAGNOSIS — M25512 Pain in left shoulder: Secondary | ICD-10-CM

## 2016-12-12 MED ORDER — CYCLOBENZAPRINE HCL 10 MG PO TABS: 10.0000 mg | ORAL_TABLET | Freq: Two times a day (BID) | ORAL | 0 refills | Status: DC | PRN

## 2016-12-12 MED ORDER — DICLOFENAC SODIUM 75 MG PO TBEC: 75.0000 mg | DELAYED_RELEASE_TABLET | Freq: Two times a day (BID) | ORAL | 0 refills | Status: DC

## 2016-12-12 NOTE — Discharge Instructions (Signed)
You most likely have a strained muscle in your back. I have prescribed two medicines for your pain. The first is diclofenac, take 1 tablet twice a day and the other is Flexeril, take 1 tablet twice a day. Flexeril may cause drowsiness so do not drive until you know how this medicine affects you. Also do not drink any alcohol either. You may apply ice and alternate with heat for 15 minutes at a time 4 times daily and for additional pain control you may take tylenol over the counter ever 4 hours but do not take more than 4000 mg a day. Should your pain continue or fail to resolve, follow up with your primary care provider or return to clinic as needed.
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
## 2017-05-26 DIAGNOSIS — M1711 Unilateral primary osteoarthritis, right knee: Secondary | ICD-10-CM | POA: Insufficient documentation

## 2017-06-10 ENCOUNTER — Ambulatory Visit: Payer: Self-pay | Admitting: Internal Medicine

## 2017-06-10 ENCOUNTER — Encounter: Payer: Self-pay | Admitting: Internal Medicine

## 2017-06-10 VITALS — BP 132/96 | HR 70 | Resp 12 | Ht <= 58 in | Wt 194.0 lb

## 2017-06-10 DIAGNOSIS — I10 Essential (primary) hypertension: Secondary | ICD-10-CM

## 2017-06-10 DIAGNOSIS — R252 Cramp and spasm: Secondary | ICD-10-CM

## 2017-06-10 MED ORDER — LISINOPRIL-HYDROCHLOROTHIAZIDE 10-12.5 MG PO TABS: ORAL_TABLET | ORAL | 11 refills | Status: DC

## 2017-06-10 NOTE — Patient Instructions (Signed)
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
## 2017-06-11 LAB — COMPREHENSIVE METABOLIC PANEL
ALT: 32 IU/L (ref 0–32)
AST: 28 IU/L (ref 0–40)
Albumin/Globulin Ratio: 1 — ABNORMAL LOW (ref 1.2–2.2)
Albumin: 4.1 g/dL (ref 3.5–5.5)
Alkaline Phosphatase: 105 IU/L (ref 39–117)
BILIRUBIN TOTAL: 0.6 mg/dL (ref 0.0–1.2)
BUN / CREAT RATIO: 14 (ref 9–23)
BUN: 10 mg/dL (ref 6–24)
CALCIUM: 9 mg/dL (ref 8.7–10.2)
CO2: 23 mmol/L (ref 20–29)
Chloride: 104 mmol/L (ref 96–106)
Creatinine, Ser: 0.74 mg/dL (ref 0.57–1.00)
GFR, EST AFRICAN AMERICAN: 105 mL/min/{1.73_m2} (ref >59)
GFR, EST NON AFRICAN AMERICAN: 91 mL/min/{1.73_m2} (ref >59)
GLOBULIN, TOTAL: 4.1 g/dL (ref 1.5–4.5)
Glucose: 94 mg/dL (ref 65–99)
Potassium: 4.6 mmol/L (ref 3.5–5.2)
SODIUM: 141 mmol/L (ref 134–144)
TOTAL PROTEIN: 8.2 g/dL (ref 6.0–8.5)

## 2017-06-11 LAB — CBC WITH DIFFERENTIAL/PLATELET
BASOS: 0 %
Basophils Absolute: 0 10*3/uL (ref 0.0–0.2)
EOS (ABSOLUTE): 0.1 10*3/uL (ref 0.0–0.4)
Eos: 1 %
Hematocrit: 39.3 % (ref 34.0–46.6)
Hemoglobin: 13.1 g/dL (ref 11.1–15.9)
Immature Grans (Abs): 0 10*3/uL (ref 0.0–0.1)
Immature Granulocytes: 0 %
LYMPHS ABS: 2.5 10*3/uL (ref 0.7–3.1)
Lymphs: 25 %
MCH: 28.8 pg (ref 26.6–33.0)
MCHC: 33.3 g/dL (ref 31.5–35.7)
MCV: 86 fL (ref 79–97)
MONOS ABS: 1 10*3/uL — AB (ref 0.1–0.9)
Monocytes: 11 %
NEUTROS ABS: 6.1 10*3/uL (ref 1.4–7.0)
Neutrophils: 63 %
Platelets: 228 10*3/uL (ref 150–379)
RBC: 4.55 x10E6/uL (ref 3.77–5.28)
RDW: 14 % (ref 12.3–15.4)
WBC: 9.6 10*3/uL (ref 3.4–10.8)

## 2017-06-17 ENCOUNTER — Other Ambulatory Visit: Payer: Self-pay | Admitting: Licensed Clinical Social Worker

## 2017-06-18 ENCOUNTER — Ambulatory Visit (INDEPENDENT_AMBULATORY_CARE_PROVIDER_SITE_OTHER): Payer: Self-pay

## 2017-06-18 VITALS — BP 118/84 | HR 64

## 2017-06-18 DIAGNOSIS — I10 Essential (primary) hypertension: Secondary | ICD-10-CM

## 2017-06-18 NOTE — Progress Notes (Signed)
Patient BP in normal range. Patient did not start BP medication. States she never picked up the medication because she didn't  Have any money. Per Dr. Delrae AlfredMulberry have patient to come back in for BP check in 1 month. If BP is high at that point will have patient to start on BP medication.  Brandi [name] spoke with patient. Patient is scheduled for BP check on 07/17/17.

## 2017-07-30 ENCOUNTER — Ambulatory Visit: Payer: Self-pay | Admitting: Licensed Clinical Social Worker

## 2017-08-06 ENCOUNTER — Encounter: Payer: Self-pay | Admitting: Internal Medicine

## 2017-08-06 ENCOUNTER — Ambulatory Visit: Payer: Self-pay | Admitting: Internal Medicine

## 2017-08-06 VITALS — BP 124/80 | HR 82 | Resp 12 | Ht <= 58 in | Wt 195.0 lb

## 2017-08-06 DIAGNOSIS — H547 Unspecified visual loss: Secondary | ICD-10-CM

## 2017-08-06 DIAGNOSIS — R030 Elevated blood-pressure reading, without diagnosis of hypertension: Secondary | ICD-10-CM

## 2017-08-06 DIAGNOSIS — Z23 Encounter for immunization: Secondary | ICD-10-CM

## 2017-08-06 NOTE — Patient Instructions (Signed)

## 2017-08-06 NOTE — Progress Notes (Signed)
   Subjective:    Patient ID: Brandi ChampagnePetrona Mink, female    DOB: December 23, 1960, 57 y.o.   MRN: [id number]  HPI   1.  Elevated BP:  Did not start Lisinopril/HCTZ as did not think she could afford it, though only $4.  She did not recognize how inexpensive the medication was.  She is now working and can afford medication better.   Her blood pressure in follow up in February without medication was normal.  She was asked to return today for another bp check to decide what to do.  2.  Morbid obesity:  She is walking once weekly.  She could walk daily.  Walks for about 1 hour.     Breakfast:  Pancakes: margarine and honey with fried eggs. Water, orange juice or apple juice. Oatmeal sometimes with apple or grapes.    Lunch:  cracker, tuna, fruit, lemonade or water.  Dinner:  Fruit. Lettuce, beets,   3.  Decreased visual acuity:  For past 3 years, mainly near vision.  Obtained reading glasses from pharmacy.  Helps, but gets dizzy and nauseated with reading more than 1 hour.    Current Meds  Medication Sig  . meloxicam (MOBIC) 7.5 MG tablet Take 7.5 mg by mouth 2 (two) times daily.    No Known Allergies  Review of Systems     Objective:   Physical Exam  NAD HEENT:  PERRL, EOMI. Lungs:  CTA CV:  RRR without murmur or rub, radial and DP pulses normal and equal LE:  No edema        Assessment & Plan:  1.  Elevated BP without diagnosis of hypertension:  Hold on medication for now.  Continue to work on lifestyle changes.  2.  Morbid Obesity:  Extended discussion on dietary improvements and adding to daily physical activity.  Encouraged to working up to 1 hour daily of walking or other activity.  To have a plan for inclement weather as well.  3.  Decreased visual acuity:  Describes presbyopia.  Discussed delay in Optometry referrals currently.  Will proceed with putting in a referral, but unlikely to occur through [facility] in next couple of months.  [facility]  4.  HM:  Tdap.  Follow up in 3-4 months for CPE with pap and mammogram set up.

## 2017-09-24 ENCOUNTER — Ambulatory Visit: Payer: Self-pay | Admitting: Internal Medicine

## 2017-09-24 ENCOUNTER — Encounter: Payer: Self-pay | Admitting: Internal Medicine

## 2017-09-24 VITALS — BP 128/88 | HR 90 | Resp 12 | Ht <= 58 in | Wt 187.0 lb

## 2017-09-24 DIAGNOSIS — B86 Scabies: Secondary | ICD-10-CM

## 2017-09-24 MED ORDER — TRIAMCINOLONE ACETONIDE 0.1 % EX CREA: 1.0000 "application " | TOPICAL_CREAM | Freq: Three times a day (TID) | CUTANEOUS | 0 refills | Status: DC

## 2017-09-24 MED ORDER — PERMETHRIN 5 % EX CREA: 1.0000 "application " | TOPICAL_CREAM | Freq: Once | CUTANEOUS | 0 refills | Status: AC

## 2017-09-24 NOTE — Patient Instructions (Signed)
Aplique crema todo el cuerpo. Lave toda las sabanas y la ropa en la manana enjuague toda la crema del cuerpo.  Repita los pasos anteriores en una semana

## 2017-09-24 NOTE — Progress Notes (Signed)
   Subjective:    Patient ID: Brandi [name], female    DOB: Dec 13, 1960, 57 y.o.   MRN: [id number]  HPI   Started about 1 week ago.  Started with little bumps on her arms and spread all over.  Very itchy.  Feels she has on her back, but cannot see.  No pets. No yard work prior.   Cannot remember being outside prior to the rash starting. Her sister lives with her. Sister has the same rash.  She started with her first. Has been applying alcohol to the rash. Tried Benadryl for itching, but no help. Did try a new soap prior to the rash. Her sister already had the rash. Her sister works packing fruit. She is not certain if her sister has a boyfriend or is with anyone else for any period of time who may also have had the rash.  Current Meds  Medication Sig  . diphenhydrAMINE (BENADRYL) 25 mg capsule Take 25 mg by mouth every 6 (six) hours as needed.   No Known Allergies   Review of Systems     Objective:   Physical Exam Skin with scabbed and scratched lesions all over arms, anterior and posterior trunk, upper legs.  Appears to have burrows as well.  2 lesions in interdigital spaces of hands. Lungs:  CTA CV:  RRR without murmur or rub.  Radial pulses normal and equal       Assessment & Plan:  Scabies:  Elimite 5% sent to [facility] with [facility].  Discussed showering the night she uses followed by application of Elimite.  Wash off in morning after cleaning clothes and bedclothes in hot water/dryer. Repeat in 7 days.

## 2017-11-19 ENCOUNTER — Encounter: Payer: Self-pay | Admitting: Family Medicine

## 2017-11-19 ENCOUNTER — Ambulatory Visit: Payer: Self-pay | Admitting: Family Medicine

## 2017-11-19 VITALS — BP 132/78 | HR 76 | Resp 12 | Ht <= 58 in | Wt 183.0 lb

## 2017-11-19 DIAGNOSIS — R5383 Other fatigue: Secondary | ICD-10-CM | POA: Insufficient documentation

## 2017-11-19 DIAGNOSIS — R739 Hyperglycemia, unspecified: Secondary | ICD-10-CM

## 2017-11-19 LAB — GLUCOSE, POCT (MANUAL RESULT ENTRY): POC Glucose: 100 mg/dl — AB (ref 70–99)

## 2017-11-19 NOTE — Patient Instructions (Signed)
Nice to meet you  We will check blood tests.  We will calll you with the results  Drink plenty of water or fruit juice mixed half and half with water  Continue to eat healthy and limit sweets and fatty foods  If you are not feeling better in 2-3 weeks then come back  [place]antado de conocerte  Revisaremos los DIRECTVanlisis de sangre.  Le llamaremos con los [employer] Electricresultados  Beba mucha agua o jugo de fruta mezclado la mitad y la mitad con agua  Contine comiendo sano y limite los dulces y alimentos grasos  Si usted no se siente mejor en 2-3 semanas entonces venga bac

## 2017-11-19 NOTE — Addendum Note (Signed)
Addended by: Marcelino FreestoneHAZLEY, Darvell Monteforte on: 11/19/2017 03:06 PM   Modules accepted: Orders

## 2017-11-19 NOTE — Progress Notes (Signed)
Subjective  Brandi [name] is a 57 y.o. female is presenting with the following  FATIGUE For last few weeks feeling tired, lightheadness when she stands, achy all over, and a full feeling when she leans over.  Has been working on her diet - lots of fruits and veggies and lean meat.  Sweats a lot.  No nausea and vomiting or diarrhea or bleeding (no menstrual periods) or dark stools or fever or adenopathy.  Is not taking any medications (was on lisinopril in past) only ibuprofen intermittenlty for pain  Chief Complaint noted Review of Symptoms - see HPI PMH - Smoking status noted.  No history of anemia  Objective Vital Signs reviewed BP 132/78 (BP Location: Left Arm, Patient Position: Sitting, Cuff Size: Normal)   Pulse 76   Resp 12   Ht 4' 8.5" (1.435 m)   Wt 183 lb (83 kg)   LMP  (LMP Unknown)   BMI 40.30 kg/m   Able to get up and down from exam table without assistance Neurologic exam : Cn 2-7 intact Able to walk on heels and toes.   Balance normal  Heart - Regular rate and rhythm.  No murmurs, gallops or rubs.    Lungs:  Normal respiratory effort, chest expands symmetrically. Lungs are clear to auscultation, no crackles or wheezes. Neck:  No deformities, thyromegaly, masses, or tenderness noted.   Supple with full range of motion with mild pain  Abdomen: soft and non-tender without masses, organomegaly or hernias noted.  No guarding or rebound Extremities:  No cyanosis, edema, or deformity noted with good range of motion of all major joints.   Skin:  Intact without suspicious lesions or rashes  Assessments/Plans  See after visit summary for details of patient instuctions  Fatigue Subacute onset. May be due to dehydration given weather and her diet changes. No signs of infection or focal neuro disease.  Check labs for anemia and diabetes and renal liver disease.   Encourage hydration and monitor

## 2017-11-19 NOTE — Assessment & Plan Note (Addendum)
Subacute onset. May be due to dehydration given weather and her diet changes. No signs of infection or focal neuro or thyroid disease.  Check labs for anemia and diabetes and renal liver disease.   Encourage hydration and monitor.  Consider depression if work up is normal

## 2017-11-20 LAB — CBC WITH DIFFERENTIAL/PLATELET
Basophils Absolute: 0 10*3/uL (ref 0.0–0.2)
Basos: 0 %
EOS (ABSOLUTE): 0.1 10*3/uL (ref 0.0–0.4)
Eos: 1 %
Hematocrit: 42.1 % (ref 34.0–46.6)
Hemoglobin: 14.2 g/dL (ref 11.1–15.9)
Immature Grans (Abs): 0 10*3/uL (ref 0.0–0.1)
Immature Granulocytes: 0 %
Lymphocytes Absolute: 1.9 10*3/uL (ref 0.7–3.1)
Lymphs: 21 %
MCH: 30.2 pg (ref 26.6–33.0)
MCHC: 33.7 g/dL (ref 31.5–35.7)
MCV: 90 fL (ref 79–97)
Monocytes Absolute: 1 10*3/uL — ABNORMAL HIGH (ref 0.1–0.9)
Monocytes: 11 %
Neutrophils Absolute: 6.2 10*3/uL (ref 1.4–7.0)
Neutrophils: 67 %
Platelets: 222 10*3/uL (ref 150–450)
RBC: 4.7 x10E6/uL (ref 3.77–5.28)
RDW: 14 % (ref 12.3–15.4)
WBC: 9.3 10*3/uL (ref 3.4–10.8)

## 2017-11-20 LAB — COMPREHENSIVE METABOLIC PANEL
ALT: 20 IU/L (ref 0–32)
AST: 24 IU/L (ref 0–40)
Albumin/Globulin Ratio: 1.1 — ABNORMAL LOW (ref 1.2–2.2)
Albumin: 4.3 g/dL (ref 3.5–5.5)
Alkaline Phosphatase: 102 IU/L (ref 39–117)
BUN/Creatinine Ratio: 21 (ref 9–23)
BUN: 17 mg/dL (ref 6–24)
Bilirubin Total: 0.6 mg/dL (ref 0.0–1.2)
CO2: 22 mmol/L (ref 20–29)
Calcium: 9.5 mg/dL (ref 8.7–10.2)
Chloride: 103 mmol/L (ref 96–106)
Creatinine, Ser: 0.81 mg/dL (ref 0.57–1.00)
GFR calc Af Amer: 94 mL/min/{1.73_m2} (ref >59)
GFR calc non Af Amer: 81 mL/min/{1.73_m2} (ref >59)
Globulin, Total: 3.8 g/dL (ref 1.5–4.5)
Glucose: 82 mg/dL (ref 65–99)
Potassium: 4.6 mmol/L (ref 3.5–5.2)
Sodium: 141 mmol/L (ref 134–144)
Total Protein: 8.1 g/dL (ref 6.0–8.5)

## 2017-11-20 LAB — HGB A1C W/O EAG: Hgb A1c MFr Bld: 5.6 % (ref 4.8–5.6)

## 2017-11-26 ENCOUNTER — Ambulatory Visit: Payer: Self-pay | Admitting: Licensed Clinical Social Worker

## 2017-11-26 DIAGNOSIS — F329 Major depressive disorder, single episode, unspecified: Secondary | ICD-10-CM

## 2017-11-26 DIAGNOSIS — F32A Depression, unspecified: Secondary | ICD-10-CM

## 2017-11-30 NOTE — Progress Notes (Signed)
   THERAPY PROGRESS NOTE  Session Time: 60min  Participation Level: Active  Behavioral Response: CasualAlertDepressed  Type of Therapy: Individual Therapy  Treatment Goals addressed: Coping  Interventions: Strength-based and Supportive  Summary: Brandi [name] is a 57 y.o. female who presents with a depressed mood and appropriate affect. She reported that she was seeking counseling at this time due to stress and depressive symptoms. She shared that she is mostly stressed about finances and housing, as she currently lives with her two brothers but knows that she needs to move out soon. She described the various reasons that she cannot live with any of her three adult children. She requested help in applying for housing programs. Brandi Faceetrona also shared her grief around her husband leaving her after 30 plus years of marriage; this happened about three years ago. She reported that he cheated several times and she realized that he was not going to change. She shared that she has also struggled with the acceptance that her daughter is gay, as it clashes with her religious beliefs.   Suicidal/Homicidal: Nowithout intent/plan  Therapist Response: LCSW began the clinical assessment but was unable to finish due to time constraints. LCSW utilized supportive counseling techniques throughout the session in order to validate emotions and encourage open expression of emotion. LCSW and Haylie processed about her current stressors and prioritized her needs.  Plan: Return again in 1 weeks.    Brandi Simmeratosha Ledora Delker, LCSW 11/30/2017

## 2017-12-01 ENCOUNTER — Ambulatory Visit: Payer: Self-pay | Admitting: Licensed Clinical Social Worker

## 2017-12-01 DIAGNOSIS — F439 Reaction to severe stress, unspecified: Secondary | ICD-10-CM

## 2017-12-03 NOTE — Progress Notes (Signed)
LCSW completed case management visit with Isabellah to address housing issues. LCSW assisted Jamylah in completing a housing application with [facility]. LCSW also helped her to create a new email account so that she could do the online application. LCSW taught her how to use her email and phone functions on her cellphone. Tenya expressed relief that the housing application is complete. She shared that she has no other pressing needs at this time.

## 2017-12-07 ENCOUNTER — Other Ambulatory Visit: Payer: Self-pay

## 2017-12-07 DIAGNOSIS — Z13220 Encounter for screening for lipoid disorders: Secondary | ICD-10-CM

## 2017-12-08 LAB — LIPID PANEL W/O CHOL/HDL RATIO
Cholesterol, Total: 170 mg/dL (ref 100–199)
HDL: 53 mg/dL (ref >39)
LDL Calculated: 90 mg/dL (ref 0–99)
Triglycerides: 134 mg/dL (ref 0–149)
VLDL Cholesterol Cal: 27 mg/dL (ref 5–40)

## 2017-12-10 ENCOUNTER — Encounter: Payer: Self-pay | Admitting: Internal Medicine

## 2017-12-10 ENCOUNTER — Ambulatory Visit: Payer: Self-pay | Admitting: Internal Medicine

## 2017-12-10 VITALS — BP 130/88 | HR 70 | Resp 12 | Ht <= 58 in | Wt 186.0 lb

## 2017-12-10 DIAGNOSIS — Z1239 Encounter for other screening for malignant neoplasm of breast: Secondary | ICD-10-CM

## 2017-12-10 DIAGNOSIS — F419 Anxiety disorder, unspecified: Secondary | ICD-10-CM

## 2017-12-10 DIAGNOSIS — Z1231 Encounter for screening mammogram for malignant neoplasm of breast: Secondary | ICD-10-CM

## 2017-12-10 DIAGNOSIS — Z124 Encounter for screening for malignant neoplasm of cervix: Secondary | ICD-10-CM

## 2017-12-10 DIAGNOSIS — R030 Elevated blood-pressure reading, without diagnosis of hypertension: Secondary | ICD-10-CM

## 2017-12-10 DIAGNOSIS — K029 Dental caries, unspecified: Secondary | ICD-10-CM

## 2017-12-10 DIAGNOSIS — Z0000 Encounter for general adult medical examination without abnormal findings: Secondary | ICD-10-CM
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
## 2017-12-14 LAB — CYTOLOGY - PAP

## 2017-12-16 ENCOUNTER — Ambulatory Visit: Payer: Self-pay | Admitting: Licensed Clinical Social Worker

## 2017-12-16 ENCOUNTER — Telehealth: Payer: Self-pay | Admitting: Licensed Clinical Social Worker

## 2017-12-16 NOTE — Telephone Encounter (Signed)
LCSW called patient due to missing her counseling appointment. Pt reported that she did not come because she did not have money for her co-pay. She stated that she will re-schedule once she has her co-pay. LCSW informed her about no-show fees but will not charge for today since patient was unaware of fee.

## 2018-01-04 IMAGING — DX DG SHOULDER 2+V*L*
4 series · 4 of 4 positions shown · non-contrast
Comparison: None.

CLINICAL DATA: Chronic left shoulder pain for 5 years, with acute
injury to left shoulder while playing with son at home. Initial
encounter.

EXAM:
LEFT SHOULDER - 2+ VIEW

[shoulder grashey (1 of 2)]
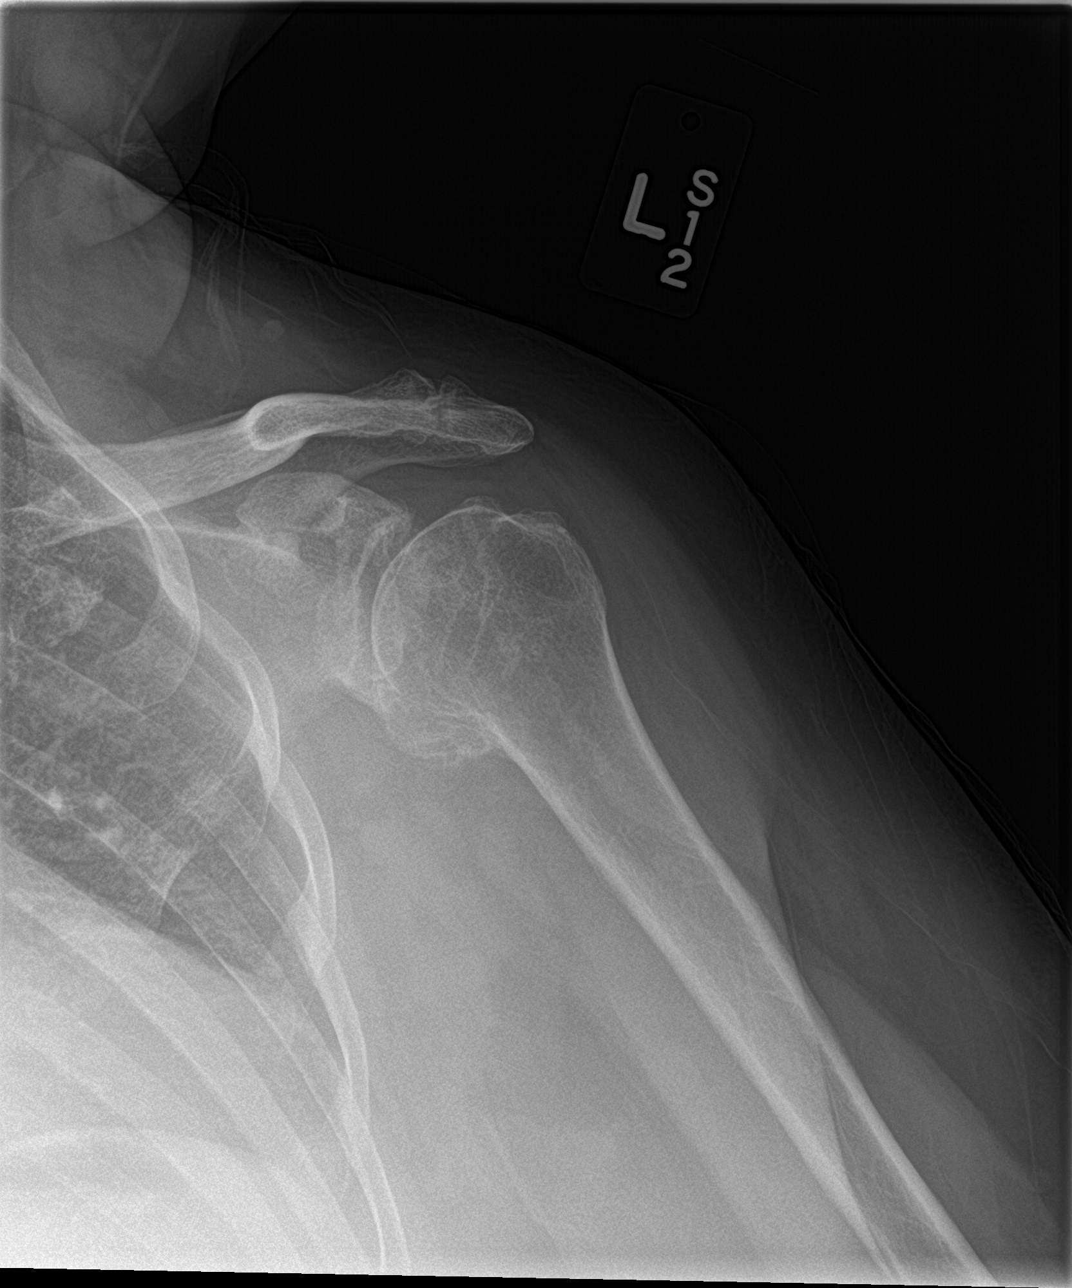

[shoulder y view]
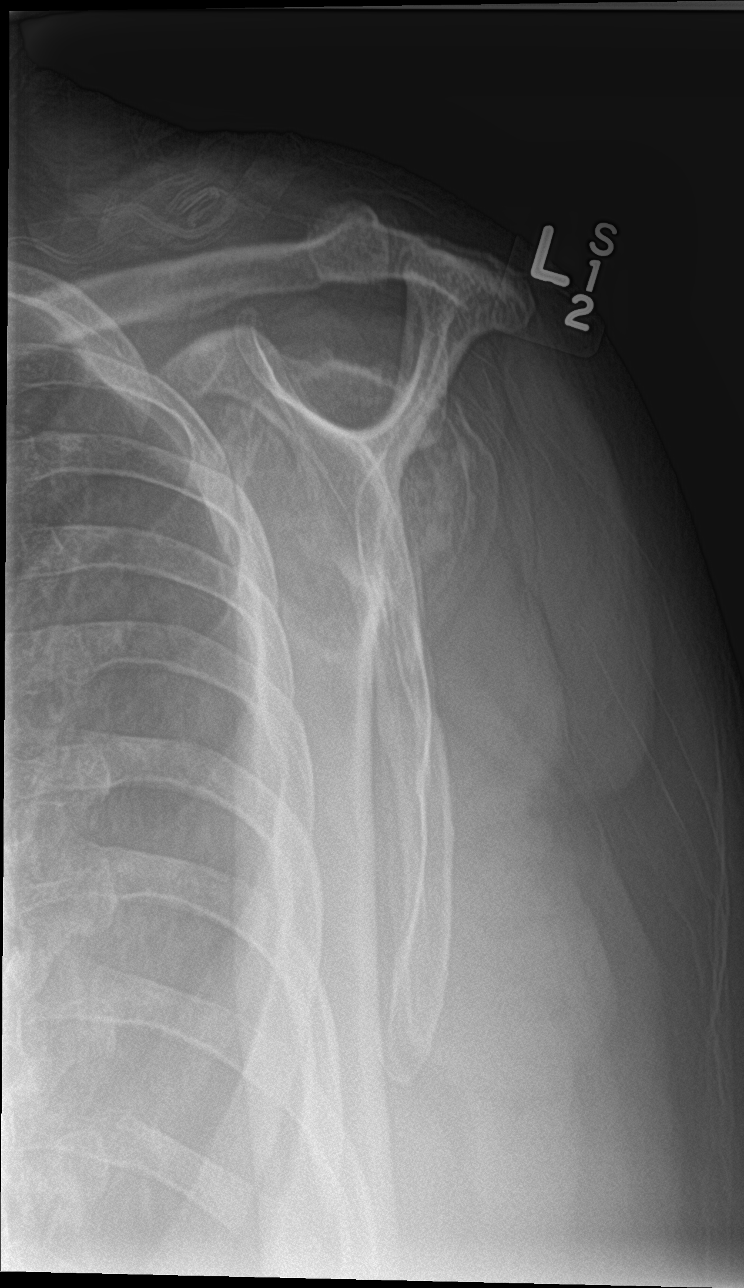

[shoulder axillary]
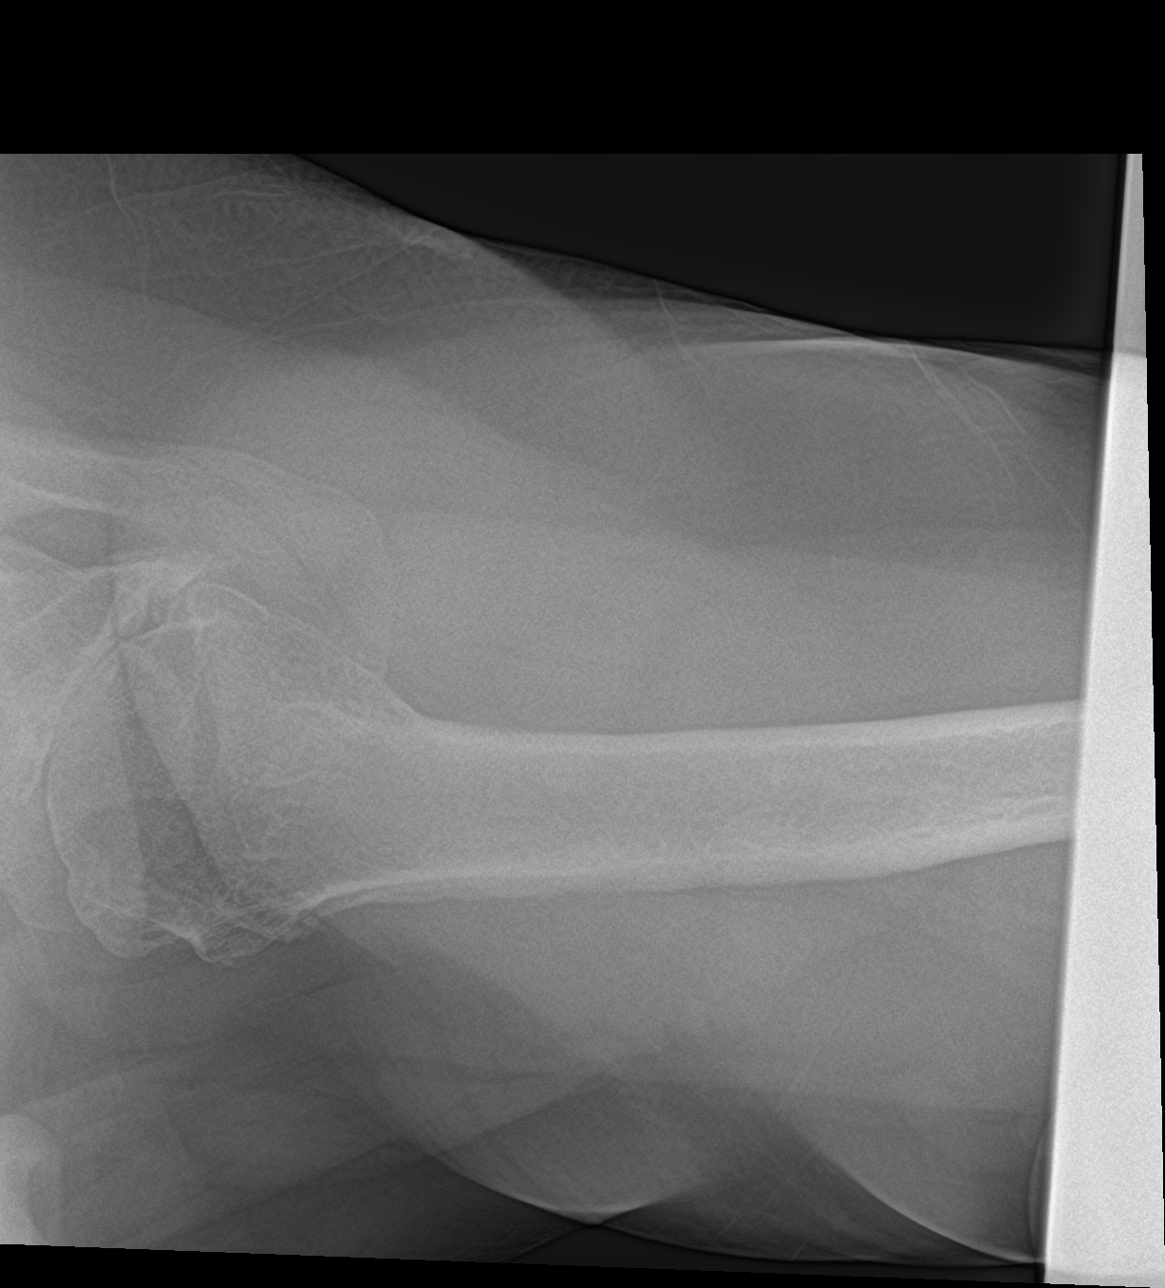

[shoulder grashey (2 of 2)]
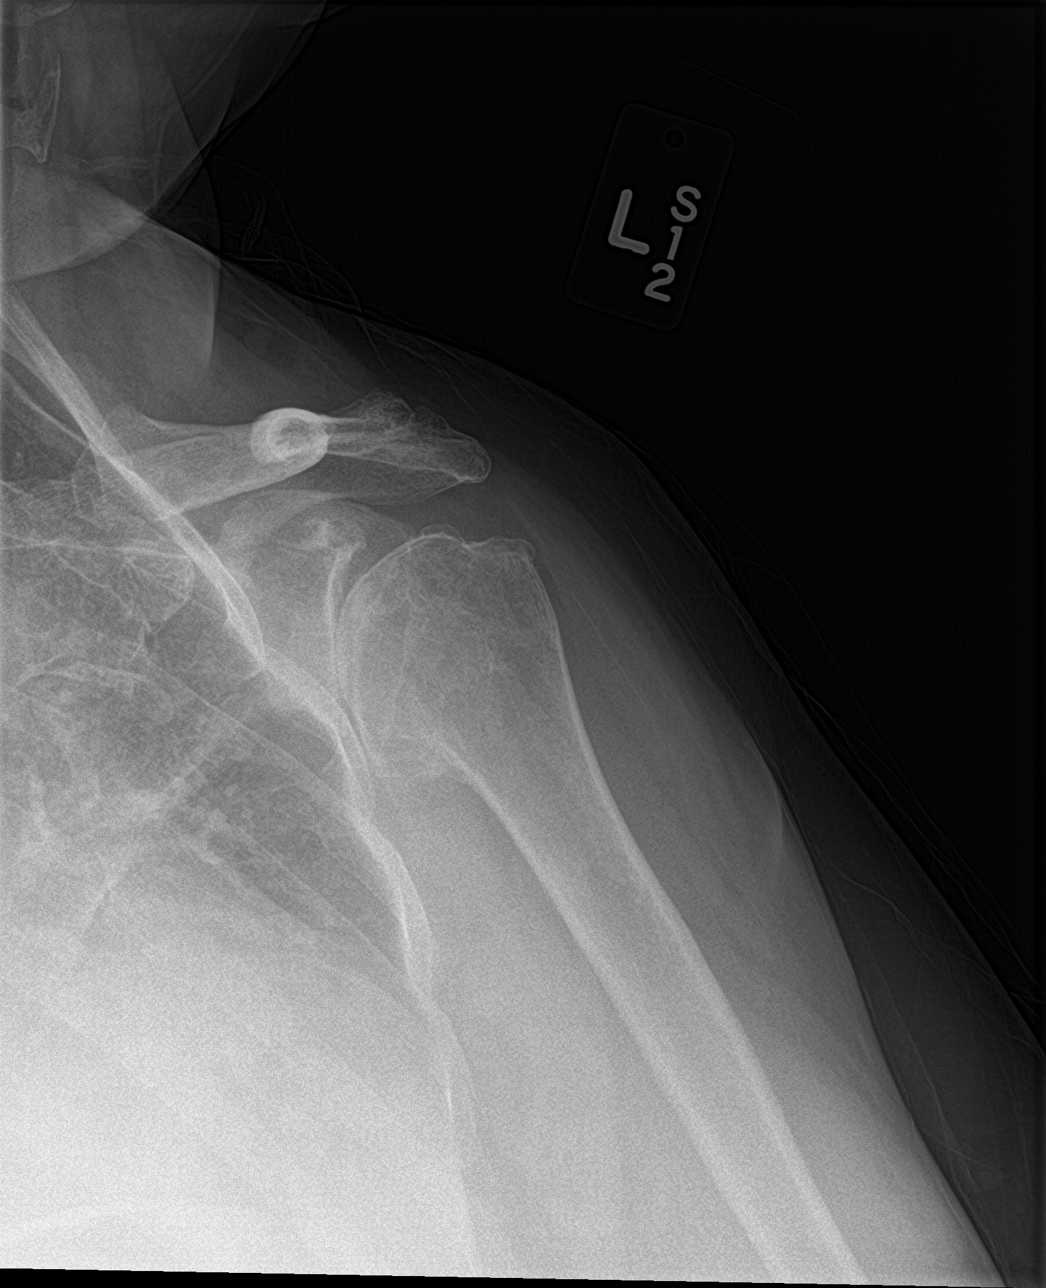

[4 of 4 positions shown; findings below may reference images not displayed]

FINDINGS: There is no evidence of fracture or dislocation. The left humeral
head is seated within the glenoid fossa. There is narrowing of the
glenohumeral joint, with associated sclerosis and slight bony
remodeling. Osteophyte formation is noted along the medial inferior
aspect of the humeral head. Mild degenerative change is noted at the
left acromioclavicular joint. No significant soft tissue
abnormalities are seen. The visualized portions of the left lung are
clear.
IMPRESSION: 1. No evidence of fracture or dislocation.
2. Mild osteoarthritis at the left glenohumeral joint, with joint
space narrowing and osteophyte formation.
3. Mild degenerative change at the left acromioclavicular joint.

## 2018-01-14 ENCOUNTER — Ambulatory Visit: Payer: Self-pay | Admitting: Internal Medicine

## 2018-03-25 IMAGING — DX DG KNEE COMPLETE 4+V*R*
4 series · 4 of 4 positions shown · non-contrast
Comparison: None.

CLINICAL DATA: Right knee injury at work yesterday with pain.

EXAM:
RIGHT KNEE - COMPLETE 4+ VIEW

[knee ap]
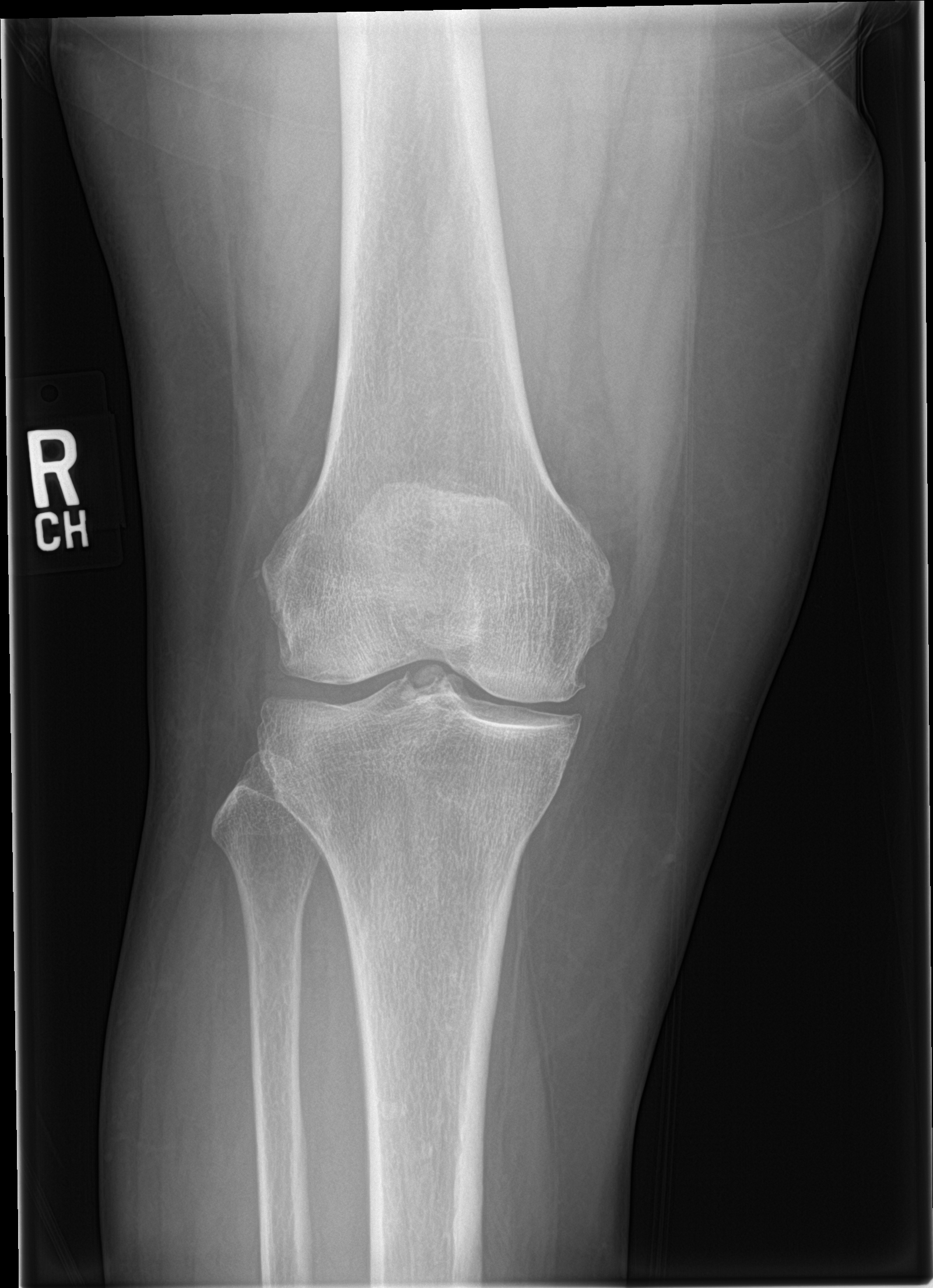

[tunnel]
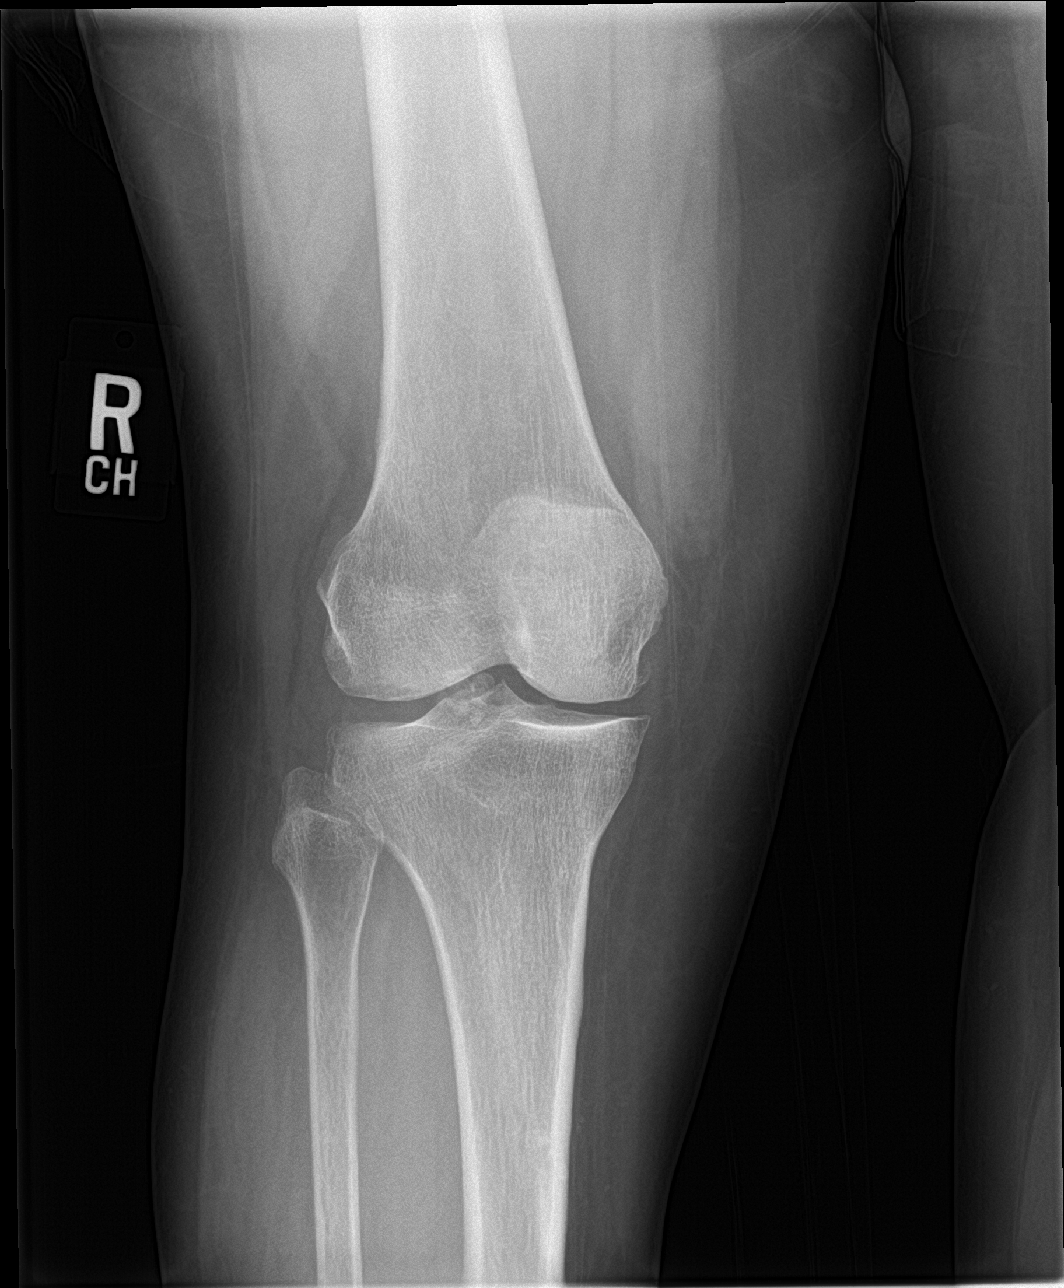

[knee lat]
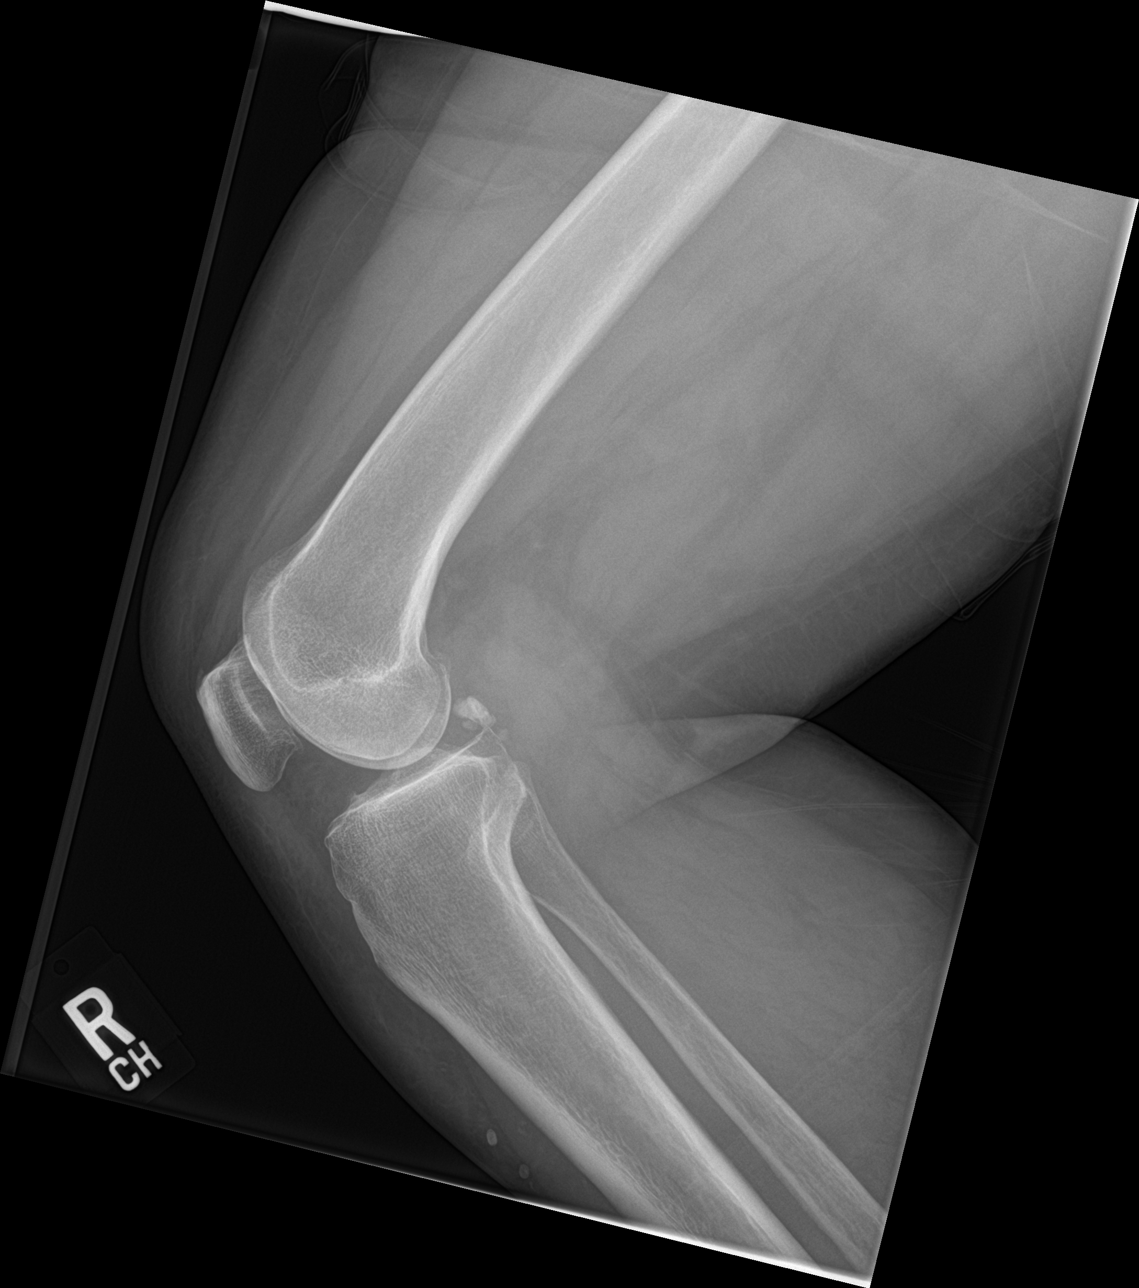

[knee obl]
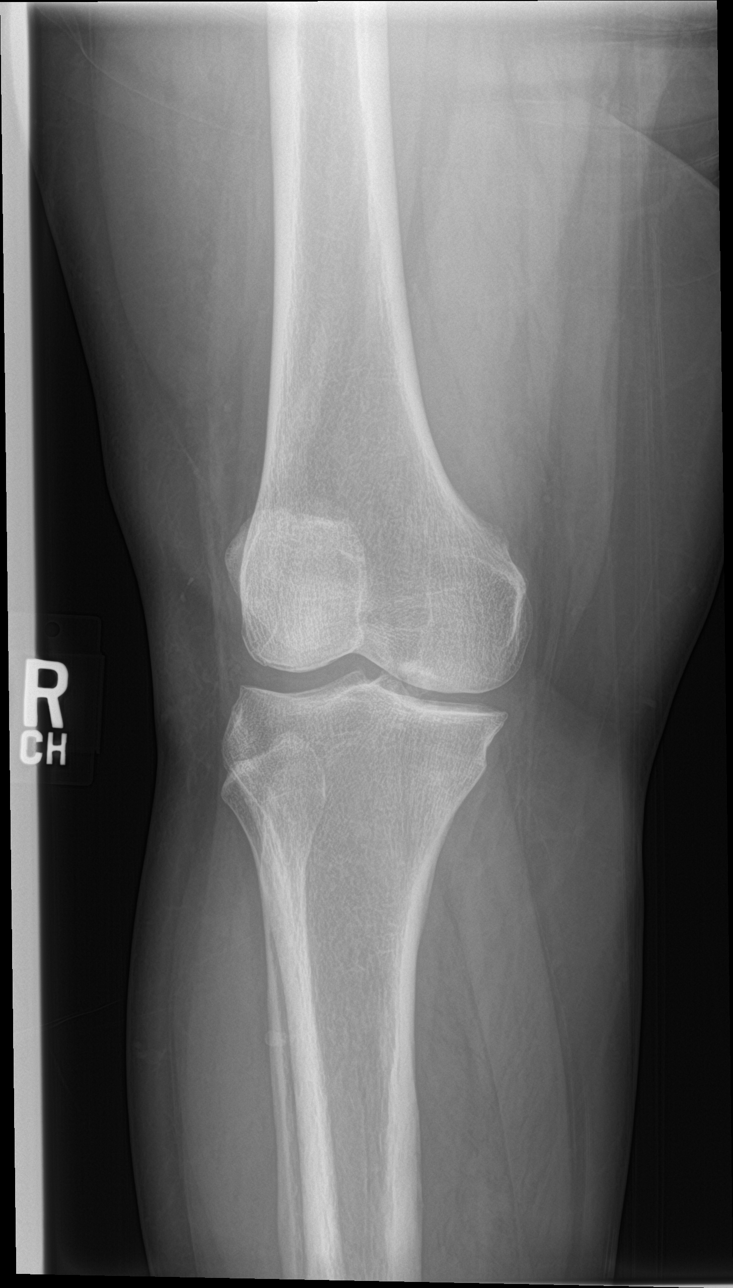

[4 of 4 positions shown; findings below may reference images not displayed]

FINDINGS: No evidence of fracture, dislocation, or joint effusion.

Mild degenerative marginal spurring about the medial and
patellofemoral compartments. 11 mm ossified body in the posterior
joint line compatible with articular body.
IMPRESSION: 1. No acute finding.
2. Mild degenerative spurring with posterior articular body.

## 2018-05-13 ENCOUNTER — Encounter: Payer: Self-pay | Admitting: Internal Medicine

## 2018-05-13 ENCOUNTER — Ambulatory Visit: Payer: Self-pay | Admitting: Internal Medicine

## 2018-05-13 VITALS — BP 112/82 | HR 80 | Resp 14 | Ht <= 58 in | Wt 194.0 lb

## 2018-05-13 DIAGNOSIS — K644 Residual hemorrhoidal skin tags: Secondary | ICD-10-CM

## 2018-05-13 DIAGNOSIS — R300 Dysuria: Secondary | ICD-10-CM

## 2018-05-13 LAB — POCT URINALYSIS DIPSTICK
BILIRUBIN UA: NEGATIVE
Blood, UA: NEGATIVE
Glucose, UA: NEGATIVE
KETONES UA: NEGATIVE
Leukocytes, UA: NEGATIVE
Nitrite, UA: NEGATIVE
PH UA: 7.5 (ref 5.0–8.0)
Protein, UA: NEGATIVE
Spec Grav, UA: 1.01 (ref 1.010–1.025)
UROBILINOGEN UA: 0.2 U/dL

## 2018-05-13 MED ORDER — TRIAMCINOLONE ACETONIDE 0.1 % EX OINT: 1.0000 "application " | TOPICAL_OINTMENT | Freq: Two times a day (BID) | CUTANEOUS | 0 refills | Status: DC

## 2018-05-13 MED ORDER — TRIAMCINOLONE ACETONIDE 0.1 % EX OINT: TOPICAL_OINTMENT | CUTANEOUS | 0 refills | Status: DC

## 2018-05-13 NOTE — Patient Instructions (Signed)
Lavar delicadamente Aplica Witch Hazel con un algodon Aplica pomada de Triamcinolone

## 2018-05-13 NOTE — Progress Notes (Signed)
   Subjective:   Patient ID: Brandi [name], female    DOB: Mar 30, 1961, 58 y.o.   MRN: [id number]  HPI   Felt a bump on her anus about 1 week and feels her anus is swollen.  Not having pain associated with this.  No bleeding.   No diarrhea or constipation.   She has noted flat ribbon like stools. Has not applied any topical OTC meds. She has just been using soap and water to wash frequently.  Not using harsh soap and not washing aggressively.   Also, noting pain into umbilicus with urination for about 1/2 to 1 week.  No hematuria.   Only gets the pain after holding her urine for a prolonged time.   No urinary frequency.   No outpatient medications have been marked as taking for the 05/13/18 encounter (Office Visit) with Julieanne Manson, MD.   No Known Allergies Review of Systems    Objective:  Physical Exam  Abd:  S, NT, No HSM or mass.  No flank tenderness.  No suprpubic tenderness.   Hemorrhoid without inflammation at 6 O'clock.  Mildly tender on palpation.  Compressible.   No other mass, Heme negative light brown stool.  UA normal   Assessment & Plan:  1.  External and likely internal hemorrhoids:  Anusol too expensive.   Gentle cleaning to area after BM Witch Hazel pads patted to area following. Apply triamcinolone 0.1% cream twice daily to area as needed.   Avoid straining.   2.  Dysuria:  No findings   Increase fiber and fluids in diet.

## 2018-05-30 ENCOUNTER — Encounter: Payer: Self-pay | Admitting: Internal Medicine

## 2018-06-10 ENCOUNTER — Ambulatory Visit: Payer: Self-pay | Admitting: Internal Medicine

## 2018-07-21 ENCOUNTER — Other Ambulatory Visit: Payer: Self-pay

## 2018-07-21 ENCOUNTER — Ambulatory Visit: Payer: Self-pay | Admitting: Internal Medicine

## 2018-07-21 ENCOUNTER — Encounter: Payer: Self-pay | Admitting: Internal Medicine

## 2018-07-21 VITALS — BP 130/82 | HR 76 | Resp 12 | Ht <= 58 in | Wt 193.0 lb

## 2018-07-21 DIAGNOSIS — K029 Dental caries, unspecified: Secondary | ICD-10-CM | POA: Insufficient documentation

## 2018-07-21 DIAGNOSIS — K1120 Sialoadenitis, unspecified: Secondary | ICD-10-CM

## 2018-07-21 MED ORDER — PENICILLIN V POTASSIUM 500 MG PO TABS: ORAL_TABLET | ORAL | 0 refills | Status: DC

## 2018-07-21 NOTE — Progress Notes (Signed)
    Subjective:    Patient ID: Brandi [name], female   DOB: 1960/09/04, 58 y.o.   MRN: [id number]   HPI   Bit her tongue while eating about 1 week ago.  She only has pain with blisters under her tongue at this point.  A bit better than before, but still painful.   Has some burning with eating.  Does not really have pain otherwise.   Having knots in her right anterior cervical area of neck and right ear pain since. Also with dental decay of molars on the right side and she is concerned this may be adding to her problems with her neck and ear.  Teeth are broken off on that side.  One tooth is loose.  No swelling or discharge from the gingiva on that side  Current Meds  Medication Sig  . fexofenadine (ALLEGRA) 60 MG tablet Take 60 mg by mouth 2 (two) times daily.  Marland Kitchen ibuprofen (ADVIL,MOTRIN) 200 MG tablet Take 200 mg by mouth every 6 (six) hours as needed.   No Known Allergies   Review of Systems    Objective:   BP 130/82 (BP Location: Left Arm, Patient Position: Sitting, Cuff Size: Large)   Pulse 76   Resp 12   Ht 4' 8.5" (1.435 m)   Wt 193 lb (87.5 kg)   LMP  (LMP Unknown)   BMI 42.51 kg/m   Physical Exam   NAD HEENT:  PERRL, EOMI, left TM not visible behind cerumen.  Right TM a bit dull, but without erythema.  Throat without injection.  Right lower molars with cavities, missing some.  More pronounced with upper jaw--only one molar still there. Teeth loose and with pain on manipulation, lower jaw, but no erythema of fluctuance of surrounding gingiva. Underside of right tongue with 2 long ulcers with white overlying exudate.  Does have erythema surrounding. One of the ulcers runs the length of the submandibular duct with mild surrounding swelling and erythema.  No discharge from submandibular duct. Neck:  Tender and enlarged, firm submandibular gland on right, may also have anterior cervical adenopathy as part of the mass.   No left sided findings.   Supple, Chest:  CTA  CV:  RRR without murmur or rub.  Radial and DP pulses normal and equal     Assessment & Plan   1.  Right submandibular Sialoadenitis:  Penicillin V K 500 mg 1/2 tab 4 times daily for 7 days.   Continue gargle with Listerine Mix of Maalox and Benadryl swish and spit every 4 hours as needed.   To call if does not improve.  2.  Dental decay:  Suspect a contributor to her current situation.  Penicillin as above.   Dental referral.

## 2018-07-21 NOTE — Patient Instructions (Signed)
Maalox 5 ml mezcla con Benadryl liquido 5 ml gorgorea y escupe cada 4 horas a necesite dolor de lingua

## 2018-12-02 ENCOUNTER — Ambulatory Visit (INDEPENDENT_AMBULATORY_CARE_PROVIDER_SITE_OTHER): Payer: Self-pay | Admitting: Internal Medicine

## 2018-12-02 ENCOUNTER — Encounter: Payer: Self-pay | Admitting: Internal Medicine

## 2018-12-02 VITALS — BP 144/84 | HR 66 | Resp 12 | Ht <= 58 in | Wt 189.0 lb

## 2018-12-02 DIAGNOSIS — M79602 Pain in left arm: Secondary | ICD-10-CM

## 2018-12-02 DIAGNOSIS — R12 Heartburn: Secondary | ICD-10-CM

## 2018-12-02 DIAGNOSIS — R030 Elevated blood-pressure reading, without diagnosis of hypertension: Secondary | ICD-10-CM

## 2018-12-02 NOTE — Patient Instructions (Signed)

## 2018-12-02 NOTE — Progress Notes (Signed)
    Subjective:    Patient ID: Brandi [name], female   DOB: 10-11-1960, 58 y.o.   MRN: [id number]   HPI   1.  Left arm pain:  Started about 6 months ago.  Last week, much more painful. Has been massaging the arm and pain has improved. Describes the pain as sore, like she was hit on her upper lateral arm.  Hurts when she lies on her abdomen or when she rests her arm on the elbow.  Feels the pain first started on her elbow.   She states she lies on her stomach with her elbows propped on bed holding book.  Spent 2 hours reading about 1 week ago and noted pain to worsen afterward.  2.  Obesity:  Lost 5 lbs from January visit.  Has cut out sweets and cut back on starches.  Eating more vegetables, fruits. Feels she is bloated as if she is gaining weight. Walking daily for 1 mile/takes about 1 hour.  She has only been doing this for 1 week.  Prior to pandemic, was going to gym, which closed.  3.  Elevated BP:  Has had this previously without diagnosis of hypertension.  4.  Heartburn--2 weeks ago and then resolved with use of Tums.  Not a current problem.  Current Meds  Medication Sig  . ibuprofen (ADVIL,MOTRIN) 200 MG tablet Take 200 mg by mouth every 6 (six) hours as needed.   No Known Allergies   Review of Systems    Objective:   BP (!) 138/92 (BP Location: Left Arm, Patient Position: Sitting, Cuff Size: Large)   Pulse 66   Resp 12   Ht 4' 8.5" (1.435 m)   Wt 189 lb (85.7 kg)   LMP  (LMP Unknown)   BMI 41.63 kg/m   Physical Exam  NAD HEENT:  PERRL, EOMI Neck:  Supple, No adenopathy Chest:  CTA CV:  RRR without murmur or rub.  Radial and DP pulses normal and equal Abd:  S, NT, No HSM or mass, + BS Left elbow:  Full ROM.  No swelling, contusion, erythema.  NT currently. LE:  No edema   Assessment & Plan   1.  Left arm/elbow pain:  No findings today.  Encouraged not reading in the position she describes with pressure on her elbows.  To call if she avoids and still  recurs.  2.  Elevated BP:  Discussed weight loss with lifestyle changes at length.  To make small goals that are reachable.  Follow up in 3 months for repeat bp monitoring and weight.  3.  Obesity:  As in #3.    4.  Heartburn:  Currently asymptomatic and no findings on exam.  To call if having symptoms regularly.  Went over reflux precautions/foods to avoid.

## 2019-03-03 ENCOUNTER — Encounter: Payer: Self-pay | Admitting: Internal Medicine

## 2019-03-03 ENCOUNTER — Ambulatory Visit: Payer: Self-pay | Admitting: Internal Medicine

## 2019-05-12 ENCOUNTER — Encounter: Payer: Self-pay | Admitting: Internal Medicine

## 2019-05-12 ENCOUNTER — Ambulatory Visit: Payer: Self-pay | Admitting: Internal Medicine

## 2019-10-22 ENCOUNTER — Ambulatory Visit: Payer: Self-pay

## 2019-10-22 ENCOUNTER — Ambulatory Visit: Payer: Self-pay | Attending: Internal Medicine

## 2019-10-22 DIAGNOSIS — Z23 Encounter for immunization: Secondary | ICD-10-CM

## 2019-10-22 NOTE — Progress Notes (Signed)
   Covid-19 Vaccination Clinic  Name:  Brandi [name]    MRN: [id number] DOB: Jul 26, 1960  10/22/2019  [name] was observed post Covid-19 immunization for 15 minutes without incident. She was provided with Vaccine Information Sheet and instruction to access the V-Safe system.   [name] was instructed to call 911 with any severe reactions post vaccine: Marland Kitchen Difficulty breathing  . Swelling of face and throat  . A fast heartbeat  . A bad rash all over body  . Dizziness and weakness   Immunizations Administered    Name Date Dose VIS Date Route   Pfizer COVID-19 Vaccine 10/22/2019 10:20 AM 0.3 mL 06/29/2018 Intramuscular   Manufacturer: [employer], [employer]   Lot: [id number]   NDC: [address]-3      Covid-19 Vaccination Clinic  Name:  Brandi [name]    MRN: [id number] DOB: [birth year]  10/22/2019  [name] was observed post Covid-19 immunization for 15 minutes without incident. She was provided with Vaccine Information Sheet and instruction to access the V-Safe system.   [name] was instructed to call 911 with any severe reactions post vaccine: Marland Kitchen Difficulty breathing  . Swelling of face and throat  . A fast heartbeat  . A bad rash all over body  . Dizziness and weakness   Immunizations Administered    Name Date Dose VIS Date Route   Pfizer COVID-19 Vaccine 10/22/2019 10:20 AM 0.3 mL 06/29/2018 Intramuscular   Manufacturer: [employer], [employer]   Lot: [id number]   NDC: [address]-3

## 2019-12-02 ENCOUNTER — Ambulatory Visit: Payer: Self-pay | Attending: Nurse Practitioner | Admitting: Nurse Practitioner

## 2019-12-02 ENCOUNTER — Encounter: Payer: Self-pay | Admitting: Nurse Practitioner

## 2019-12-02 ENCOUNTER — Other Ambulatory Visit: Payer: Self-pay

## 2019-12-02 VITALS — Ht <= 58 in | Wt 189.0 lb

## 2019-12-02 DIAGNOSIS — Z131 Encounter for screening for diabetes mellitus: Secondary | ICD-10-CM

## 2019-12-02 DIAGNOSIS — Z13220 Encounter for screening for lipoid disorders: Secondary | ICD-10-CM

## 2019-12-02 DIAGNOSIS — Z114 Encounter for screening for human immunodeficiency virus [HIV]: Secondary | ICD-10-CM

## 2019-12-02 DIAGNOSIS — Z13228 Encounter for screening for other metabolic disorders: Secondary | ICD-10-CM

## 2019-12-02 DIAGNOSIS — Z1329 Encounter for screening for other suspected endocrine disorder: Secondary | ICD-10-CM

## 2019-12-02 DIAGNOSIS — Z1321 Encounter for screening for nutritional disorder: Secondary | ICD-10-CM

## 2019-12-02 DIAGNOSIS — Z1159 Encounter for screening for other viral diseases: Secondary | ICD-10-CM

## 2019-12-02 DIAGNOSIS — Z130 Encounter for screening for diseases of the blood and blood-forming organs and certain disorders involving the immune mechanism: Secondary | ICD-10-CM

## 2019-12-02 DIAGNOSIS — Z7689 Persons encountering health services in other specified circumstances: Secondary | ICD-10-CM

## 2019-12-02 DIAGNOSIS — M79641 Pain in right hand: Secondary | ICD-10-CM

## 2019-12-02 DIAGNOSIS — M79642 Pain in left hand: Secondary | ICD-10-CM

## 2019-12-02 DIAGNOSIS — Z1211 Encounter for screening for malignant neoplasm of colon: Secondary | ICD-10-CM

## 2019-12-02 MED ORDER — IBUPROFEN 600 MG PO TABS: 600.0000 mg | ORAL_TABLET | Freq: Three times a day (TID) | ORAL | 1 refills | Status: AC | PRN

## 2019-12-02 NOTE — Progress Notes (Signed)
Virtual Visit via Telephone Note Due to national recommendations of social distancing due to [place] 19, telehealth visit is felt to be most appropriate for this patient at this time.  I discussed the limitations, risks, security and privacy concerns of performing an evaluation and management service by telephone and the availability of in person appointments. I also discussed with the patient that there may be a patient responsible charge related to this service. The patient expressed understanding and agreed to proceed.    I connected with Brandi [name] on 12/02/19  at   3:50 PM EDT  EDT by telephone and verified that I am speaking with the correct person using two identifiers.   Consent I discussed the limitations, risks, security and privacy concerns of performing an evaluation and management service by telephone and the availability of in person appointments. I also discussed with the patient that there may be a patient responsible charge related to this service. The patient expressed understanding and agreed to proceed.   Location of Patient: Private Residence   Location of Provider: [place] and [employer] Office    Persons participating in Telemedicine visit: Brandi Rankins FNP-BC [name] Interpreter ID# [phone]   History of Present Illness: Telemedicine visit for: Establish Care  has a past medical history of Elevated BP without diagnosis of hypertension, GERD (gastroesophageal reflux disease), and Obesity.  VRI was used to communicate directly with patient for the entire encounter including providing detailed patient instructions.  Patient has been counseled on age-appropriate routine health concerns for screening and prevention. These are reviewed and up-to-date. Referrals have been placed accordingly. Immunizations are up-to-date or declined.    Mammogram: She has never had a mammogram. Referred to [facility].  Colonoscopy: She has  never had a colonosocopy. FIT pending.   Carpal Tunnel Syndrome: Patient presents for presents evaluation of possible carpal tunnel syndrome.  Onset of the symptoms was several months ago. Current symptoms include Pain, numbness, burning and tingling in the bilateral hands. Inciting event/aggravating factors: work related repetitive activity: . Patient's course of XF:[id number] worsening. Evaluation to date: none.  Treatment to date: none.   Past Medical History:  Diagnosis Date  . Elevated BP without diagnosis of hypertension   . GERD (gastroesophageal reflux disease)   . Obesity     Past Surgical History:  Procedure Laterality Date  . Arthroscopic Knee Surgery Right 2018   Repair of ligament or tendon damage from work related injury  . CESAREAN SECTION  1990  . TUBAL LIGATION  1993    Family History  Problem Relation Age of Onset  . Stroke Mother        cause of death  . Hypertension Mother   . Cancer Maternal Aunt        Breast  . Pneumonia Father   . Hyperlipidemia Sister   . Depression Sister   . Depression Sister   . Colitis Sister   . Pneumonia Brother     Social History   Socioeconomic History  . Marital status: Legally Separated    Spouse name: Not on file  . Number of children: 3  . Years of education: 6  . Highest education level: Not on file  Occupational History  . Occupation: Housewife  Tobacco Use  . Smoking status: Never Smoker  . Smokeless tobacco: Never Used  Vaping Use  . Vaping Use: Never used  Substance and Sexual Activity  . Alcohol use: No  . Drug use: No  .  Sexual activity: Not Currently  Other Topics Concern  . Not on file  Social History Narrative   Lives with her sister in [place] along with a cousin   One child lives nearby, but is not really involved with her   Does go to church, but does not really have much support from the congregation as well.      Social Determinants of Health   Financial Resource Strain:   .  Difficulty of Paying Living Expenses:   Food Insecurity:   . Worried About Charity fundraiser in the Last Year:   . Arboriculturist in the Last Year:   Transportation Needs:   . Film/video editor (Medical):   Marland Kitchen Lack of Transportation (Non-Medical):   Physical Activity:   . Days of Exercise per Week:   . Minutes of Exercise per Session:   Stress:   . Feeling of Stress :   Social Connections:   . Frequency of Communication with Friends and Family:   . Frequency of Social Gatherings with Friends and Family:   . Attends Religious Services:   . Active Member of Clubs or Organizations:   . Attends Archivist Meetings:   Marland Kitchen Marital Status:      Observations/Objective: Awake, alert and oriented x 3   ROS  Assessment and Plan: [name] was seen today for new patient (initial visit).  Diagnoses and all orders for this visit:  Encounter to establish care  Thyroid disorder screening -     TSH; Future -     TSH  Screening for deficiency anemia -     CBC; Future -     CBC  Lipid screening -     Lipid panel; Future -     Lipid panel  Screening for metabolic disorder -     JJK09+FGHW; Future -     CMP14+EGFR  Encounter for screening for diabetes mellitus -     Hemoglobin A1c; Future -     Hemoglobin A1c  Encounter for vitamin deficiency screening -     VITAMIN D 25 Hydroxy (Vit-D Deficiency, Fractures); Future -     VITAMIN D 25 Hydroxy (Vit-D Deficiency, Fractures)  Colon cancer screening -     Fecal occult blood, imunochemical([facility]); Future  Encounter for hepatitis C screening test for low risk patient -     Hepatitis C Antibody; Future -     Hepatitis C Antibody  Encounter for screening for human immunodeficiency virus (HIV) -     HIV antibody (with reflex); Future -     HIV antibody (with reflex)  Bilateral hand pain -     ibuprofen (ADVIL) 600 MG tablet; Take 1 tablet (600 mg total) by mouth every 8 (eight) hours as  needed. Recommended bilateral hand splints for 4-6 weeks. Patient has been advised to apply for financial assistance and schedule to see our financial counselor.    Follow Up Instructions Return in about 2 months (around 02/02/2020).     I discussed the assessment and treatment plan with the patient. The patient was provided an opportunity to ask questions and all were answered. The patient agreed with the plan and demonstrated an understanding of the instructions.   The patient was advised to call back or seek an in-person evaluation if the symptoms worsen or if the condition fails to improve as anticipated.  I provided 19 minutes of non-face-to-face time during this encounter including median intraservice time, reviewing previous notes, labs, imaging,  medications and explaining diagnosis and management.  Gildardo Pounds, FNP-BC

## 2019-12-03 ENCOUNTER — Encounter: Payer: Self-pay | Admitting: Nurse Practitioner

## 2019-12-03 LAB — CBC
Hematocrit: 38 % (ref 34.0–46.6)
Hemoglobin: 13 g/dL (ref 11.1–15.9)
MCH: 30.3 pg (ref 26.6–33.0)
MCHC: 34.2 g/dL (ref 31.5–35.7)
MCV: 89 fL (ref 79–97)
Platelets: 249 10*3/uL (ref 150–450)
RBC: 4.29 x10E6/uL (ref 3.77–5.28)
RDW: 12.5 % (ref 11.7–15.4)
WBC: 8.6 10*3/uL (ref 3.4–10.8)

## 2019-12-03 LAB — LIPID PANEL
Chol/HDL Ratio: 2.9 ratio (ref 0.0–4.4)
Cholesterol, Total: 171 mg/dL (ref 100–199)
HDL: 58 mg/dL (ref >39)
LDL Chol Calc (NIH): 85 mg/dL (ref 0–99)
Triglycerides: 165 mg/dL — ABNORMAL HIGH (ref 0–149)
VLDL Cholesterol Cal: 28 mg/dL (ref 5–40)

## 2019-12-03 LAB — CMP14+EGFR
ALT: 19 IU/L (ref 0–32)
AST: 25 IU/L (ref 0–40)
Albumin/Globulin Ratio: 1.1 — ABNORMAL LOW (ref 1.2–2.2)
Albumin: 4.1 g/dL (ref 3.8–4.9)
Alkaline Phosphatase: 100 IU/L (ref 48–121)
BUN/Creatinine Ratio: 18 (ref 9–23)
BUN: 17 mg/dL (ref 6–24)
Bilirubin Total: 0.4 mg/dL (ref 0.0–1.2)
CO2: 23 mmol/L (ref 20–29)
Calcium: 8.9 mg/dL (ref 8.7–10.2)
Chloride: 108 mmol/L — ABNORMAL HIGH (ref 96–106)
Creatinine, Ser: 0.93 mg/dL (ref 0.57–1.00)
GFR calc Af Amer: 78 mL/min/{1.73_m2} (ref >59)
GFR calc non Af Amer: 68 mL/min/{1.73_m2} (ref >59)
Globulin, Total: 3.7 g/dL (ref 1.5–4.5)
Glucose: 111 mg/dL — ABNORMAL HIGH (ref 65–99)
Potassium: 4.1 mmol/L (ref 3.5–5.2)
Sodium: 143 mmol/L (ref 134–144)
Total Protein: 7.8 g/dL (ref 6.0–8.5)

## 2019-12-03 LAB — HEPATITIS C ANTIBODY: Hep C Virus Ab: <0.1 s/co ratio (ref 0.0–0.9)

## 2019-12-03 LAB — HIV ANTIBODY (ROUTINE TESTING W REFLEX): HIV Screen 4th Generation wRfx: NONREACTIVE

## 2019-12-03 LAB — VITAMIN D 25 HYDROXY (VIT D DEFICIENCY, FRACTURES): Vit D, 25-Hydroxy: 22.8 ng/mL — ABNORMAL LOW (ref 30.0–100.0)

## 2019-12-03 LAB — HEMOGLOBIN A1C
Est. average glucose Bld gHb Est-mCnc: 117 mg/dL
Hgb A1c MFr Bld: 5.7 % — ABNORMAL HIGH (ref 4.8–5.6)

## 2019-12-03 LAB — TSH: TSH: 5.7 u[IU]/mL — ABNORMAL HIGH (ref 0.450–4.500)

## 2019-12-05 MED FILL — IBUPROFEN 600 MG TABLET: 600 | 20 days supply | Qty: 60 | Fill #0

## 2019-12-08 ENCOUNTER — Telehealth: Payer: Self-pay

## 2019-12-08 NOTE — Telephone Encounter (Signed)
Telephoned patient at home number using (interpreter Jacquiline Doe).Left a voice message with [facility] contact information.

## 2019-12-13 ENCOUNTER — Other Ambulatory Visit: Payer: Self-pay | Admitting: Obstetrics and Gynecology

## 2019-12-13 DIAGNOSIS — Z1231 Encounter for screening mammogram for malignant neoplasm of breast: Secondary | ICD-10-CM

## 2019-12-15 ENCOUNTER — Ambulatory Visit: Payer: Self-pay | Attending: Nurse Practitioner

## 2019-12-15 ENCOUNTER — Other Ambulatory Visit: Payer: Self-pay

## 2019-12-28 ENCOUNTER — Telehealth: Payer: Self-pay | Admitting: Nurse Practitioner

## 2019-12-28 NOTE — Telephone Encounter (Signed)
Pt was sent a letter from financial dept. Inform them, that the application they submitted was incomplete, since they were missing some documentation at the time of the appointment, Pt need to reschedule and resubmit all new papers and application for CAFA and [place], P.S. old documents has been sent back by mail to the Pt and Pt. need to make a new appt. 

## 2020-01-02 ENCOUNTER — Telehealth: Payer: Self-pay | Admitting: Nurse Practitioner

## 2020-01-02 NOTE — Telephone Encounter (Signed)
Return to Pt late documents for the application today 01/02/20

## 2020-01-17 ENCOUNTER — Ambulatory Visit: Payer: Self-pay | Admitting: Nurse Practitioner

## 2020-01-19 ENCOUNTER — Ambulatory Visit: Payer: Self-pay

## 2020-01-24 ENCOUNTER — Telehealth: Payer: Self-pay | Admitting: Nurse Practitioner

## 2020-01-24 NOTE — Telephone Encounter (Signed)
Pt was call an schedule a financial appt for 02/01/20

## 2020-02-01 ENCOUNTER — Other Ambulatory Visit: Payer: Self-pay

## 2020-02-01 ENCOUNTER — Ambulatory Visit: Payer: Self-pay | Attending: Nurse Practitioner

## 2020-03-13 ENCOUNTER — Encounter: Payer: Self-pay | Admitting: Nurse Practitioner

## 2020-07-11 ENCOUNTER — Emergency Department (HOSPITAL_BASED_OUTPATIENT_CLINIC_OR_DEPARTMENT_OTHER)
Admission: EM | Admit: 2020-07-11 | Discharge: 2020-07-11 | Disposition: A | Discharge status: Home / Self Care | Payer: Self-pay | Attending: Emergency Medicine | Admitting: Emergency Medicine

## 2020-07-11 ENCOUNTER — Encounter (HOSPITAL_BASED_OUTPATIENT_CLINIC_OR_DEPARTMENT_OTHER): Payer: Self-pay

## 2020-07-11 ENCOUNTER — Other Ambulatory Visit: Payer: Self-pay

## 2020-07-11 ENCOUNTER — Emergency Department (HOSPITAL_BASED_OUTPATIENT_CLINIC_OR_DEPARTMENT_OTHER): Payer: Self-pay

## 2020-07-11 DIAGNOSIS — I10 Essential (primary) hypertension: Secondary | ICD-10-CM | POA: Insufficient documentation

## 2020-07-11 DIAGNOSIS — Y92410 Unspecified street and highway as the place of occurrence of the external cause: Secondary | ICD-10-CM | POA: Insufficient documentation

## 2020-07-11 DIAGNOSIS — M549 Dorsalgia, unspecified: Secondary | ICD-10-CM | POA: Insufficient documentation

## 2020-07-11 DIAGNOSIS — M25512 Pain in left shoulder: Secondary | ICD-10-CM | POA: Diagnosis not present

## 2020-07-11 MED ORDER — IBUPROFEN 800 MG PO TABS
800.0000 mg | ORAL_TABLET | Freq: Once | ORAL | Status: AC
  Administered 2020-07-11: 800 mg via ORAL
  Filled 2020-07-11: qty 1

## 2020-07-11 MED ORDER — LIDOCAINE 5 % EX PTCH
1.0000 | MEDICATED_PATCH | CUTANEOUS | Status: DC
  Administered 2020-07-11: 1 via TRANSDERMAL
  Filled 2020-07-11: qty 1

## 2020-07-11 MED ORDER — METHOCARBAMOL 500 MG PO TABS: 500.0000 mg | ORAL_TABLET | Freq: Two times a day (BID) | ORAL | 0 refills | Status: DC
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
## 2020-07-11 NOTE — ED Notes (Signed)
Spanish interpreter used during triage
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
## 2020-09-21 ENCOUNTER — Encounter: Payer: Self-pay | Admitting: Nurse Practitioner

## 2020-09-21 ENCOUNTER — Other Ambulatory Visit: Payer: Self-pay

## 2020-09-21 ENCOUNTER — Ambulatory Visit: Payer: Self-pay | Attending: Nurse Practitioner | Admitting: Nurse Practitioner

## 2020-09-21 VITALS — BP 123/74 | HR 74 | Ht <= 58 in | Wt 188.4 lb

## 2020-09-21 DIAGNOSIS — M25512 Pain in left shoulder: Secondary | ICD-10-CM

## 2020-09-21 DIAGNOSIS — Z13220 Encounter for screening for lipoid disorders: Secondary | ICD-10-CM

## 2020-09-21 DIAGNOSIS — G8929 Other chronic pain: Secondary | ICD-10-CM

## 2020-09-21 DIAGNOSIS — Z131 Encounter for screening for diabetes mellitus: Secondary | ICD-10-CM

## 2020-09-21 DIAGNOSIS — Z1211 Encounter for screening for malignant neoplasm of colon: Secondary | ICD-10-CM

## 2020-09-21 DIAGNOSIS — Z0000 Encounter for general adult medical examination without abnormal findings: Secondary | ICD-10-CM

## 2020-09-21 DIAGNOSIS — Z130 Encounter for screening for diseases of the blood and blood-forming organs and certain disorders involving the immune mechanism: Secondary | ICD-10-CM

## 2020-09-21 DIAGNOSIS — I10 Essential (primary) hypertension: Secondary | ICD-10-CM

## 2020-09-21 DIAGNOSIS — N814 Uterovaginal prolapse, unspecified: Secondary | ICD-10-CM

## 2020-09-21 DIAGNOSIS — R5383 Other fatigue: Secondary | ICD-10-CM

## 2020-09-21 MED ORDER — IBUPROFEN 600 MG PO TABS
600.0000 mg | ORAL_TABLET | Freq: Three times a day (TID) | ORAL | 0 refills | Status: DC | PRN
Start: 2020-09-21 — End: 2020-09-21

## 2020-09-21 MED ORDER — METHOCARBAMOL 500 MG PO TABS
500.0000 mg | ORAL_TABLET | Freq: Two times a day (BID) | ORAL | 1 refills | Status: DC
  Filled 2020-09-21: qty 40, 20d supply, fill #0

## 2020-09-21 MED ORDER — IBUPROFEN 600 MG PO TABS
600.0000 mg | ORAL_TABLET | Freq: Three times a day (TID) | ORAL | 0 refills | Status: DC | PRN
  Filled 2020-09-21: qty 60, 20d supply, fill #0

## 2020-09-21 NOTE — Progress Notes (Signed)
Assessment & Plan:  Brandi [name] was seen today for annual exam.  Diagnoses and all orders for this visit:  Encounter for annual physical exam  Essential hypertension -     CMP14+EGFR Continue all antihypertensives as prescribed.  Remember to bring in your blood pressure log with you for your follow up appointment.  DASH/Mediterranean Diets are healthier choices for HTN.    Screening for deficiency anemia -     CBC  Other fatigue -     CBC  Lipid screening -     Lipid panel  Encounter for screening for diabetes mellitus -     Hemoglobin A1c  Colon cancer screening -     Fecal occult blood, imunochemical([facility])  Uterine prolapse -     Ambulatory referral to Gynecology  Chronic left shoulder pain -     ibuprofen (ADVIL) 600 MG tablet; Take 1 tablet (600 mg total) by mouth every 8 (eight) hours as needed. -     methocarbamol (ROBAXIN) 500 MG tablet; Take 1 tablet (500 mg total) by mouth 2 (two) times daily. Work on losing weight to help reduce joint pain. May alternate with heat and ice application for pain relief. May also alternate with acetaminophen  as prescribed pain relief. Other alternatives include massage, acupuncture and water aerobics.  You must stay active and avoid a sedentary lifestyle.     Patient has been counseled on age-appropriate routine health concerns for screening and prevention. These are reviewed and up-to-date. Referrals have been placed accordingly. Immunizations are up-to-date or declined.    Subjective:   Chief Complaint  Patient presents with  . Annual Exam   HPI Brandi [name] 60 y.o. female presents to office today for annual physical  VRI was used to communicate directly with patient for the entire encounter including providing detailed patient instructions.   HOH Endorses decreased hearing in bilateral ears.  She has had problems with her ears most of her life and reports she had an eardrum burst in the right ear as a  child.  She has significant cerumen accumulation on exam today and I am unable to visualize the TM anatomy    Shoulder Pain She is unable to raise the Left arm above her shoulder during her breast exam. shoulder pain with breast exam.  Abnormal xray 07-11-20 1. 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 Degree of degenerative changes similar to minimally increased from prior. 2. Questionable lucency through the inferior humeral spurs, could reflect an acutely fractured osteophyte versus more degenerative change. 3. Additional mild-to-moderate arthrosis of the acromioclavicular joint. She will need to be evaluated by ortho for this.    Uterine Prolapse Feels like something is hanging out her vagina. She has mild uterine prolapse on exam. Will need to see gynecology for this.   Review of Systems  Constitutional: Negative for fever, malaise/fatigue and weight loss.  HENT: Positive for hearing loss. Negative for nosebleeds.   Eyes: Negative.  Negative for blurred vision, double vision and photophobia.  Respiratory: Negative.  Negative for cough and shortness of breath.   Cardiovascular: Negative.  Negative for chest pain, palpitations and leg swelling.  Gastrointestinal: Negative.  Negative for heartburn, nausea and vomiting.  Genitourinary:       See HPI  Musculoskeletal: Positive for joint pain. Negative for myalgias.  Neurological: Negative.  Negative for dizziness, focal weakness, seizures and headaches.  Psychiatric/Behavioral: Negative.  Negative for suicidal ideas.  Past Medical History:  Diagnosis Date  . Elevated BP without diagnosis of hypertension   . GERD (gastroesophageal reflux disease)   . Obesity     Past Surgical History:  Procedure Laterality Date  . Arthroscopic Knee Surgery Right 2018   Repair of ligament or tendon damage from work related injury  . CESAREAN SECTION   1990  . TUBAL LIGATION  1993    Family History  Problem Relation Age of Onset  . Stroke Mother        cause of death  . Hypertension Mother   . Cancer Maternal Aunt        Breast  . Pneumonia Father   . Hyperlipidemia Sister   . Depression Sister   . Depression Sister   . Colitis Sister   . Pneumonia Brother     Social History Reviewed with no changes to be made today.   Outpatient Medications Prior to Visit  Medication Sig Dispense Refill  . fexofenadine (ALLEGRA) 60 MG tablet Take 60 mg by mouth 2 (two) times daily.    . methocarbamol (ROBAXIN) 500 MG tablet Take 1 tablet (500 mg total) by mouth 2 (two) times daily. 20 tablet 0   No facility-administered medications prior to visit.    No Known Allergies     Objective:    BP 123/74   Pulse 74   Ht 4' 8.5" (1.435 m)   Wt 188 lb 6.4 oz (85.5 kg)   LMP  (LMP Unknown)   SpO2 97%   BMI 41.49 kg/m  Wt Readings from Last 3 Encounters:  09/21/20 188 lb 6.4 oz (85.5 kg)  07/11/20 180 lb (81.6 kg)  12/02/19 189 lb (85.7 kg)    Physical Exam Vitals and nursing note reviewed.  Constitutional:      Appearance: She is well-developed.  HENT:     Head: Normocephalic and atraumatic.  Cardiovascular:     Rate and Rhythm: Normal rate and regular rhythm.     Heart sounds: Normal heart sounds. No murmur heard. No friction rub. No gallop.   Pulmonary:     Effort: Pulmonary effort is normal. No tachypnea or respiratory distress.     Breath sounds: Normal breath sounds. No decreased breath sounds, wheezing, rhonchi or rales.  Chest:     Chest wall: No tenderness.  Abdominal:     General: Bowel sounds are normal.     Palpations: Abdomen is soft.  Musculoskeletal:        General: Normal range of motion.     Cervical back: Normal range of motion.  Skin:    General: Skin is warm and dry.  Neurological:     Mental Status: She is alert and oriented to person, place, and time.     Coordination: Coordination normal.   Psychiatric:        Behavior: Behavior normal. Behavior is cooperative.        Thought Content: Thought content normal.        Judgment: Judgment normal.          Patient has been counseled extensively about nutrition and exercise as well as the importance of adherence with medications and regular follow-up. The patient was given clear instructions to go to ER or return to medical center if symptoms don't improve, worsen or new problems develop. The patient verbalized understanding.   Follow-up: Return in about 3 months (around 12/22/2020) for PAP SMEAR. See dr Erik Obey and nurse for decreased hearing and ear wax blockage.  Gildardo Pounds, FNP-BC [facility] [place], [place]   09/21/2020, 1:23 PM

## 2020-09-21 NOTE — Patient Instructions (Addendum)
[facility] Social Services 2.7 [phone] Google reviews Department of social services in [place], [place] Address: [address] [place], [place] [address] Hours:  Open ? Closes 5PM Phone: [phone]    Intolerancia a la lactosa, en adultos Lactose Intolerance, Adult La lactosa es el azcar natural que se encuentra en la [place] y los productos lcteos como el queso y Curator. La lactosa es digerida por la lactasa, una protena (enzima) que se encuentra en el intestino delgado. Algunas personas no producen la cantidad suficiente de lactasa para Therapist, nutritional. Esto se conoce como intolerancia a Water quality scientist. La intolerancia a la lactosa es diferente de la alergia a la [place], [place] reaccin ms grave a la protena de la [place]. Cules son las causas? Las causas de la intolerancia a la lactosa pueden incluir las siguientes:  El envejecimiento normal. La capacidad de producir lactasa puede disminuir con la edad y causar intolerancia a la lactosa con el paso del [place].  Haber nacido sin la capacidad de Scientist, forensic.  Enfermedades digestivas, como gastroenteritis o enfermedad inflamatoria del intestino (EII).  Ciruga o lesin en el intestino delgado.  Infecciones intestinales.  Ciertos antibiticos y tratamientos para Management consultant. Cules son los signos o los sntomas? La intolerancia a la Psychologist, forensic dentro de los 30 minutos a las 2 horas luego de que coma o beba algo que Engineer, manufacturing. Entre los sntomas se pueden incluir los siguientes:  Nuseas.  Diarrea.  Dolor y clicos abdominales.  Un sensacin de tener el abdomen lleno, apretado o adolorido (distensin abdominal).  Gases. Cmo se diagnostica? Esta afeccin se puede diagnosticar en funcin de lo siguiente:  Los sntomas y antecedentes mdicos.  Prueba de tolerancia a la lactosa. Esta prueba implica tomar una solucin con lactosa y luego realizar anlisis de sangre para medir la  cantidad de Event organiser. Si el nivel de glucemia en sangre no sube, significa que el organismo no es capaz de Therapist, nutritional.  Prueba de aliento (prueba de hidrgeno en el aliento) para determinar la tolerancia a la lactosa. Esta prueba implica beber una solucin con lactosa y Express Scripts exhalar dentro de una especie de bolsa mientras digiere la solucin. Tener mucho hidrgeno en el aliento puede ser un signo de intolerancia a la [place].  Prueba de Assurant. Implica beber Neomia Dear solucin con lactosa y luego analizar Lauris Poag de heces para detectar bacterias. Tener muchas bacterias hace que las heces se consideren cidas, lo que es un signo de intolerancia a Water quality scientist.   Cmo se trata? No hay tratamiento para mejorar la capacidad del organismo de Scientist, forensic. Sin embargo, Magazine features editor los sntomas en su casa haciendo lo siguiente:  Limite o evite la [place] de [place], los productos lcteos y otras fuentes de Advice worker.  Tome comprimidos de lactasa cuando coma productos lcteos. Los comprimidos de lactasa son medicamentos de venta libre que pueden ayudar a Therapist, nutritional. Tambin puede agregar gotas de lactasa a la [place] regular.  Haga ajustes en su dieta, como beber [place] sin [place]. La tolerancia a la lactosa vara de Neomia Dear persona a otra. Algunas personas pueden comer o beber pequeas cantidades de productos que contienen [place], y Heritage manager personas deben evitar todos los alimentos y bebidas que contienen [place]. Pregntele a su mdico cul tratamiento es el mejor para usted. Siga estas indicaciones en su casa:  Limite o evite los alimentos, bebidas y medicamentos que contengan [place], como se lo haya indicado  el mdico. Haga un seguimiento respecto a qu alimentos, bebidas o medicamentos le provocan sntomas de modo que pueda saber cules evitar en el futuro.  Lea con cuidado las etiquetas de los alimentos y [place]. Evite los productos que  contengan: ? Lactosa. ? Slidos de Freescale Semiconductor. ? Casena. ? Suero de [place].  CenterPoint Energy medicamentos de venta libre y los recetados (incluso los comprimidos de Building control surveyor) solamente como se lo haya indicado el mdico.  Si deja de comer o beber productos lcteos (elimina los lcteos de su dieta), asegrese de obtener suficientes protenas, calcio y vitamina D de otros alimentos. Colabore con el mdico o especialista en dieta y nutricin (nutricionista) para asegurarse de [place] la cantidad suficiente de dichos nutrientes.  Elija un sustituto de la Mirant est fortificado con calcio y vitamina D. ? La leche de soja contiene protena de alta calidad. ? Las [place] de frutos secos o cereales (como la [place] de [place] o la de Surveyor, minerals) contienen cantidades pequeas de protena.  Concurra a todas las visitas de [address] se lo haya indicado el mdico. Esto es importante. Comunquese con un mdico si:  Los sntomas persisten despus de eliminar los productos lcteos y otras fuentes de lactosa de su dieta. Solicite ayuda de inmediato si:  Countrywide Financial.  Siente un dolor intenso en el abdomen (abdominal). Resumen  La lactosa es el azcar natural que se encuentra en la [place] y los productos lcteos como el queso y Curator. La lactosa es digerida por la [place], que es una protena (enzima) que se encuentra en el intestino delgado.  Algunas personas no producen la cantidad suficiente de lactasa para Therapist, nutritional. Esto se conoce como intolerancia a Water quality scientist.  La intolerancia a la Psychologist, forensic dentro de los 30 minutos a las 2 horas luego de que coma o beba algo que Engineer, manufacturing.  Limite o evite los alimentos, bebidas y medicamentos que contengan [place], como se lo haya indicado el mdico. Esta informacin no tiene Theme park manager el consejo del mdico. Asegrese de hacerle al mdico cualquier pregunta que tenga. Document Revised: 02/17/2017  Document Reviewed: 02/17/2017 Elsevier Patient Education  2021 Elsevier Inc. Plan de alimentacin con control de la lactosa en los adultos Lactose-Controlled Eating Plan, Adult La intolerancia a la lactosa es cuando el organismo no puede digerir la [place], un azcar natural que se encuentra en la [place] y en los productos lcteos. Si usted tiene intolerancia a Water quality scientist, Automotive engineer los Altria Group y las bebidas que contienen lactosa puede ayudarlo a Paramedic problemas digestivos, como la diarrea o Catering manager. Consejos para seguir Consulting civil engineer las etiquetas de los alimentos  Revise con cuidado las listas de ingredientes de los alimentos. Evite los alimentos preparados con [place], [place], [place], slidos de la [place], [place] en [place], [place], caseinato o suero.  Evite los productos con la nota "Puede incluir [place]".  Busque los trminos "sin lactosa" o "reducido en lactosa" en las etiquetas de los alimentos. Puede consumir alimentos sin lactosa, as como pequeas cantidades de alimentos reducidos en lactosa. De compras  Busque sustitutos no lcteos, por ejemplo, los siguientes: ? Crema no lctea. ? Leche de almendras o de soja. ? Yogur de soja o de coco. ? Queso sin [place].  Compre leche de vaca sin lactosa. Coccin  Evite cocinar con mantequilla. En su lugar, use aceites vegetales, de nuez y de semillas.  Prepare las sopas sin crema. Use otros productos  para espesar las sopas, por ejemplo, almidn de maz o pasta de tomate. Planificacin de las comidas  Evite comer alimentos que contengan lactosa.  Algunas personas que tienen intolerancia a la lactosa pueden comer alimentos que contienen pequeas cantidades de [place]. Entre los ConocoPhillips contienen menos de 1gramo de lactosa por porcin, se Baxter International siguientes: ? De 1a 2oz de queso curado, como queso suizo, cheddar o parmesano. ? 2cucharadas de queso crema. ? ?de taza de Leggett & Platt. ? taza de queso  ricota.  Algunas personas pueden [place] productos lcteos cultivados, como yogur, suero de [place] y Enterprise. Las bacterias beneficiosas de estos productos ayudan a Therapist, nutritional.  Si decide probar un alimento que contenga lactosa: ? Consuma solo un alimento con lactosa a la vez. ? Consuma solo una pequea cantidad del alimento. ? Deje de consumirlo si los sntomas regresan. Cmo consumir la cantidad suficiente de calcio  La [place] y los productos lcteos contienen mucho calcio, que es un nutriente importante para la salud. Si evita consumir leche y productos lcteos, asegrese de incorporar calcio a travs de otros alimentos.  Hable con el mdico sobre la cantidad de calcio que necesita a diario. La cantidad de calcio que necesita por da depende de su edad y de su salud general.  Entre los productos no lcteos con alto contenido de calcio, se incluyen los siguientes: ? Sardinas y salmn enlatado. ? Frijoles secos. ? Almendras. ? Hojas de nabo, berzas, col rizada y brcoli. ? Leche de soja fortificada con calcio y tofu. ? Jugo de naranja fortificado con calcio.  Hable con su mdico sobre los suplementos vitamnicos y minerales. Tome los suplementos nicamente como se le haya indicado. Resumen  Evitar los alimentos y las bebidas que contienen lactosa puede ayudarlo a Paramedic problemas digestivos, como la diarrea o el dolor estomacal.  Si evita consumir [place] y productos lcteos, asegrese de incorporar calcio a travs de otros alimentos.  Tome los suplementos vitamnicos y Market researcher como se lo haya indicado el mdico. Esta informacin no tiene Theme park manager el consejo del mdico. Asegrese de hacerle al mdico cualquier pregunta que tenga. Document Revised: 12/16/2016 Document Reviewed: 12/16/2016 Elsevier Patient Education  2021 ArvinMeritor.

## 2020-09-22 LAB — CMP14+EGFR
ALT: 24 IU/L (ref 0–32)
AST: 24 IU/L (ref 0–40)
Albumin/Globulin Ratio: 1.2 (ref 1.2–2.2)
Albumin: 4.4 g/dL (ref 3.8–4.9)
Alkaline Phosphatase: 97 IU/L (ref 44–121)
BUN/Creatinine Ratio: 16 (ref 9–23)
BUN: 14 mg/dL (ref 6–24)
Bilirubin Total: 0.8 mg/dL (ref 0.0–1.2)
CO2: 21 mmol/L (ref 20–29)
Calcium: 9.4 mg/dL (ref 8.7–10.2)
Chloride: 104 mmol/L (ref 96–106)
Creatinine, Ser: 0.88 mg/dL (ref 0.57–1.00)
Globulin, Total: 3.8 g/dL (ref 1.5–4.5)
Glucose: 97 mg/dL (ref 65–99)
Potassium: 4.2 mmol/L (ref 3.5–5.2)
Sodium: 140 mmol/L (ref 134–144)
Total Protein: 8.2 g/dL (ref 6.0–8.5)
eGFR: 76 mL/min/{1.73_m2} (ref >59)

## 2020-09-22 LAB — CBC
Hematocrit: 40 % (ref 34.0–46.6)
Hemoglobin: 13.5 g/dL (ref 11.1–15.9)
MCH: 29.3 pg (ref 26.6–33.0)
MCHC: 33.8 g/dL (ref 31.5–35.7)
MCV: 87 fL (ref 79–97)
Platelets: 224 10*3/uL (ref 150–450)
RBC: 4.6 x10E6/uL (ref 3.77–5.28)
RDW: 12.7 % (ref 11.7–15.4)
WBC: 7.5 10*3/uL (ref 3.4–10.8)

## 2020-09-22 LAB — LIPID PANEL
Chol/HDL Ratio: 3.2 ratio (ref 0.0–4.4)
Cholesterol, Total: 189 mg/dL (ref 100–199)
HDL: 59 mg/dL (ref >39)
LDL Chol Calc (NIH): 111 mg/dL — ABNORMAL HIGH (ref 0–99)
Triglycerides: 107 mg/dL (ref 0–149)
VLDL Cholesterol Cal: 19 mg/dL (ref 5–40)

## 2020-09-22 LAB — HEMOGLOBIN A1C
Est. average glucose Bld gHb Est-mCnc: 114 mg/dL
Hgb A1c MFr Bld: 5.6 % (ref 4.8–5.6)

## 2020-09-24 ENCOUNTER — Telehealth: Payer: Self-pay

## 2020-09-24 NOTE — Telephone Encounter (Signed)
[employer] interpreters mario Id# [id number] contacted pt to go over lab results pt didn't answer lvm   

## 2020-10-05 ENCOUNTER — Ambulatory Visit: Payer: Self-pay | Attending: Nurse Practitioner

## 2020-10-05 ENCOUNTER — Other Ambulatory Visit: Payer: Self-pay

## 2020-12-24 ENCOUNTER — Ambulatory Visit: Payer: Self-pay | Attending: Nurse Practitioner | Admitting: Nurse Practitioner

## 2020-12-24 ENCOUNTER — Other Ambulatory Visit: Payer: Self-pay

## 2020-12-24 NOTE — Progress Notes (Signed)
She states she is not feeling well. She would like to reschedule> Today's appointment was supposed to be for a pap smear and it was switched to  a virtual.

## 2021-01-11 ENCOUNTER — Telehealth: Payer: Self-pay | Admitting: Nurse Practitioner

## 2021-01-11 NOTE — Telephone Encounter (Signed)
I return Pt call, LVM to call us back 

## 2021-01-11 NOTE — Telephone Encounter (Addendum)
Pt needs assistance with with setting up [facility] transportation. Pt also needs assistance to help her apply with medicaid.  She says she does not know how. Pt also would like appt with Mikle Bosworth for the OC.

## 2021-01-14 NOTE — Telephone Encounter (Signed)
Pt is returning Brandi [name] call using spanish interpreter Brandi [name] ID [id number] and the office is close for lunch

## 2021-01-15 ENCOUNTER — Telehealth: Payer: Self-pay | Admitting: Nurse Practitioner

## 2021-01-15 NOTE — Telephone Encounter (Signed)
I return Pt call., she was inform that she already has a financial appt

## 2021-01-18 ENCOUNTER — Ambulatory Visit: Payer: Self-pay | Attending: Nurse Practitioner | Admitting: Nurse Practitioner

## 2021-01-18 ENCOUNTER — Other Ambulatory Visit: Payer: Self-pay

## 2021-01-18 ENCOUNTER — Encounter: Payer: Self-pay | Admitting: Nurse Practitioner

## 2021-01-18 ENCOUNTER — Other Ambulatory Visit (HOSPITAL_COMMUNITY)
Admission: RE | Admit: 2021-01-18 | Discharge: 2021-01-18 | Disposition: A | Discharge status: Home / Self Care | Payer: Self-pay | Source: Ambulatory Visit | Attending: Nurse Practitioner | Admitting: Nurse Practitioner

## 2021-01-18 VITALS — BP 141/89 | HR 81 | Ht <= 58 in | Wt 191.1 lb

## 2021-01-18 DIAGNOSIS — G8929 Other chronic pain: Secondary | ICD-10-CM

## 2021-01-18 DIAGNOSIS — Z124 Encounter for screening for malignant neoplasm of cervix: Secondary | ICD-10-CM

## 2021-01-18 DIAGNOSIS — R7303 Prediabetes: Secondary | ICD-10-CM

## 2021-01-18 DIAGNOSIS — M25512 Pain in left shoulder: Secondary | ICD-10-CM

## 2021-01-18 DIAGNOSIS — Z114 Encounter for screening for human immunodeficiency virus [HIV]: Secondary | ICD-10-CM

## 2021-01-18 DIAGNOSIS — Z1231 Encounter for screening mammogram for malignant neoplasm of breast: Secondary | ICD-10-CM

## 2021-01-18 MED ORDER — ACETAMINOPHEN-CODEINE #3 300-30 MG PO TABS
1.0000 | ORAL_TABLET | ORAL | 0 refills | Status: AC | PRN
  Filled 2021-01-18: qty 30, 5d supply, fill #0

## 2021-01-18 MED ORDER — LOSARTAN POTASSIUM 25 MG PO TABS
12.5000 mg | ORAL_TABLET | Freq: Every day | ORAL | 0 refills | Status: DC
  Filled 2021-01-18: qty 15, 30d supply, fill #0

## 2021-01-18 NOTE — Progress Notes (Signed)
Assessment & Plan:  Brandi [name] was seen today for gynecologic exam.  Diagnoses and all orders for this visit:  Encounter for Papanicolaou smear for cervical cancer screening -     Cytology - PAP -     Cervicovaginal ancillary only -     losartan (COZAAR) 25 MG tablet; Take 0.5 tablets (12.5 mg total) by mouth daily.  Chronic left shoulder pain -     acetaminophen-codeine (TYLENOL #3) 300-30 MG tablet; Take 1 tablet by mouth every 4 (four) hours as needed for moderate pain. Work on losing weight to help reduce joint pain. May alternate with heat and ice application for pain relief. May also alternate with  Ibuprofen as prescribed pain relief. Other alternatives include massage, acupuncture and water aerobics.  You must stay active and avoid a sedentary lifestyle.   Encounter for screening for HIV -     HIV antibody (with reflex)  Breast cancer screening by mammogram -     MS DIGITAL SCREENING TOMO BILATERAL; Future  Prediabetes -     Hemoglobin A1c   Patient has been counseled on age-appropriate routine health concerns for screening and prevention. These are reviewed and up-to-date. Referrals have been placed accordingly. Immunizations are up-to-date or declined.    Subjective:   Chief Complaint  Patient presents with   Gynecologic Exam   Gynecologic Exam Pertinent negatives include no abdominal pain, chills, fever, flank pain or rash.  Brandi [name] 60 y.o. female presents to office today for pap smear.    Blood pressure is elevated today. Will start losartan 12.5 mg daily. Denies chest pain, shortness of breath, palpitations, lightheadedness, dizziness, headaches or BLE edema.   BP Readings from Last 3 Encounters:  01/18/21 (!) 141/89  09/21/20 123/74  07/11/20 (!) 151/108    Shoulder Pain She has chronic left shoulder pain. Needs Ortho referral. Awaiting orange card approval. Denies any injury or trauma.  Left shoulder xray 07-11-2020 1. Severe degenerative changes  in the glenohumeral joint with extensive bony remodeling of the humeral head and periarticular spurring. Degree of degenerative changes similar to minimally increased from prior. 2. Questionable lucency through the inferior humeral spurs, could reflect an acutely fractured osteophyte versus more degenerative change. 3. Additional mild-to-moderate arthrosis of the acromioclavicular joint.      Review of Systems  Constitutional: Negative.  Negative for chills, fever, malaise/fatigue and weight loss.  Respiratory: Negative.  Negative for cough, shortness of breath and wheezing.   Cardiovascular: Negative.  Negative for chest pain, orthopnea and leg swelling.  Gastrointestinal:  Negative for abdominal pain.  Genitourinary: Negative.  Negative for flank pain.  Skin: Negative.  Negative for rash.  Psychiatric/Behavioral:  Negative for suicidal ideas.    Past Medical History:  Diagnosis Date   Elevated BP without diagnosis of hypertension    GERD (gastroesophageal reflux disease)    Obesity     Past Surgical History:  Procedure Laterality Date   Arthroscopic Knee Surgery Right 2018   Repair of ligament or tendon damage from work related injury   CESAREAN SECTION  1990   TUBAL LIGATION  1993    Family History  Problem Relation Age of Onset   Stroke Mother        cause of death   Hypertension Mother    Cancer Maternal Aunt        Breast   Pneumonia Father    Hyperlipidemia Sister    Depression Sister    Depression Sister    Colitis Sister  Pneumonia Brother     Social History Reviewed with no changes to be made today.   Outpatient Medications Prior to Visit  Medication Sig Dispense Refill   fexofenadine (ALLEGRA) 60 MG tablet Take 60 mg by mouth 2 (two) times daily.     ibuprofen (ADVIL) 600 MG tablet Take 1 tablet (600 mg total) by mouth every 8 (eight) hours as needed. (Patient not taking: Reported on 01/18/2021) 60 tablet 0   methocarbamol (ROBAXIN) 500 MG tablet  Take 1 tablet (500 mg total) by mouth 2 (two) times daily. (Patient not taking: Reported on 01/18/2021) 40 tablet 1   No facility-administered medications prior to visit.    No Known Allergies     Objective:    BP (!) 141/89   Pulse 81   Ht 4' 8.5" (1.435 m)   Wt 191 lb 2 oz (86.7 kg)   LMP  (LMP Unknown)   SpO2 98%   BMI 42.09 kg/m  Wt Readings from Last 3 Encounters:  01/18/21 191 lb 2 oz (86.7 kg)  09/21/20 188 lb 6.4 oz (85.5 kg)  07/11/20 180 lb (81.6 kg)    Physical Exam Exam conducted with a chaperone present.  Constitutional:      Appearance: She is well-developed.  HENT:     Head: Normocephalic.  Cardiovascular:     Rate and Rhythm: Normal rate and regular rhythm.     Heart sounds: Normal heart sounds.  Pulmonary:     Effort: Pulmonary effort is normal.     Breath sounds: Normal breath sounds.  Abdominal:     General: Bowel sounds are normal.     Palpations: Abdomen is soft.     Hernia: There is no hernia in the left inguinal area.  Genitourinary:    Exam position: Lithotomy position.     Labia:        Right: No rash, tenderness, lesion or injury.        Left: No rash, tenderness, lesion or injury.      Vagina: Normal. No signs of injury and foreign body. No vaginal discharge, erythema, tenderness or bleeding.     Cervix: No cervical motion tenderness or friability.     Uterus: Not deviated and not enlarged.      Adnexa:        Right: No mass, tenderness or fullness.         Left: No mass, tenderness or fullness.       Rectum: Normal. No external hemorrhoid.  Lymphadenopathy:     Lower Body: No right inguinal adenopathy. No left inguinal adenopathy.  Skin:    General: Skin is warm and dry.  Neurological:     Mental Status: She is alert and oriented to person, place, and time.  Psychiatric:        Behavior: Behavior normal.        Thought Content: Thought content normal.        Judgment: Judgment normal.         Patient has been counseled  extensively about nutrition and exercise as well as the importance of adherence with medications and regular follow-up. The patient was given clear instructions to go to ER or return to medical center if symptoms don't improve, worsen or new problems develop. The patient verbalized understanding.   Follow-up: Return in about 2 weeks (around 02/01/2021), or if symptoms worsen or fail to improve, for BP CHECK WITH LUKE. See me in 3 months HTN.   Claiborne Rigg, FNP-BC  [facility] [place], [place] [phone]   01/18/2021, 3:06 PM

## 2021-01-18 NOTE — Progress Notes (Signed)
Brandi [name] [id number]

## 2021-01-19 LAB — HIV ANTIBODY (ROUTINE TESTING W REFLEX): HIV Screen 4th Generation wRfx: NONREACTIVE

## 2021-01-19 LAB — HEMOGLOBIN A1C
Est. average glucose Bld gHb Est-mCnc: 120 mg/dL
Hgb A1c MFr Bld: 5.8 % — ABNORMAL HIGH (ref 4.8–5.6)

## 2021-01-21 ENCOUNTER — Telehealth: Payer: Self-pay | Admitting: Nurse Practitioner

## 2021-01-21 NOTE — Telephone Encounter (Signed)
Copied from CRM [id number]. Topic: General - Other >> Jan 21, 2021  9:18 AM Traci Sermon wrote: Reason for CRM: [facility] called in stating they need pt specimen she had done on 09/16, they had not received them, CB [phone] Jenne Pane

## 2021-01-22 LAB — CYTOLOGY - PAP
Comment: NEGATIVE
Diagnosis: NEGATIVE
High risk HPV: NEGATIVE

## 2021-01-25 ENCOUNTER — Ambulatory Visit: Payer: Self-pay

## 2021-02-01 ENCOUNTER — Ambulatory Visit: Payer: Self-pay | Admitting: Pharmacist

## 2021-04-18 ENCOUNTER — Telehealth: Payer: Self-pay | Admitting: Nurse Practitioner

## 2021-04-18 NOTE — Telephone Encounter (Signed)
Copied from CRM [id number]. Topic: Appointment Scheduling - Scheduling Inquiry for Clinic >> Apr 17, 2021  3:27 PM Brandi [name] wrote: Reason for CRM: The patient is scheduled for an office visit on 04/19/21 at 3:30   The patient has called to request the practice's assistance with coordinating transportation   Please contact further when possible  Called the pt and informed her that we will not be able to provide transportation and if she has the number for the transportation service to give them [name] call for [name] ride- left message on pts VM assisted by Mikle Bosworth

## 2021-04-19 ENCOUNTER — Ambulatory Visit: Payer: Self-pay | Admitting: Nurse Practitioner

## 2021-10-08 ENCOUNTER — Other Ambulatory Visit (HOSPITAL_BASED_OUTPATIENT_CLINIC_OR_DEPARTMENT_OTHER): Payer: Self-pay

## 2022-01-21 ENCOUNTER — Ambulatory Visit (HOSPITAL_COMMUNITY)
Admission: EM | Admit: 2022-01-21 | Discharge: 2022-01-21 | Disposition: A | Discharge status: Home / Self Care | Payer: Self-pay | Attending: Emergency Medicine | Admitting: Emergency Medicine

## 2022-01-21 ENCOUNTER — Other Ambulatory Visit: Payer: Self-pay

## 2022-01-21 ENCOUNTER — Encounter (HOSPITAL_COMMUNITY): Payer: Self-pay | Admitting: Emergency Medicine

## 2022-01-21 DIAGNOSIS — S29012A Strain of muscle and tendon of back wall of thorax, initial encounter: Secondary | ICD-10-CM

## 2022-01-21 MED ORDER — IBUPROFEN 800 MG PO TABS
800.0000 mg | ORAL_TABLET | Freq: Once | ORAL | Status: AC
  Administered 2022-01-21: 800 mg via ORAL

## 2022-01-21 MED ORDER — IBUPROFEN 800 MG PO TABS
ORAL_TABLET | ORAL | Status: AC
  Filled 2022-01-21: qty 1

## 2022-01-21 NOTE — Discharge Instructions (Addendum)
I recommend using ibuprofen 3 times daily as needed  This can help reduce pain and inflammation  You can try hot pad to the areas  Please return if symptoms persist.  Recomiendo usar ibuprofeno 3 veces al da segn sea necesario  Esto puede ayudar a reducir Conservation officer, historic buildings y la inflamacin  Puedes probar hot pad a las reas  Por favor, regrese si los sntomas persisten.
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
## 2022-03-10 ENCOUNTER — Encounter: Payer: Self-pay | Admitting: Nurse Practitioner

## 2022-03-10 ENCOUNTER — Ambulatory Visit: Payer: Self-pay | Attending: Nurse Practitioner | Admitting: Nurse Practitioner

## 2022-03-10 VITALS — BP 138/84 | HR 80 | Temp 97.9°F | Ht <= 58 in | Wt 191.5 lb

## 2022-03-10 DIAGNOSIS — Z0120 Encounter for dental examination and cleaning without abnormal findings: Secondary | ICD-10-CM

## 2022-03-10 DIAGNOSIS — H538 Other visual disturbances: Secondary | ICD-10-CM

## 2022-03-10 DIAGNOSIS — E559 Vitamin D deficiency, unspecified: Secondary | ICD-10-CM

## 2022-03-10 DIAGNOSIS — D649 Anemia, unspecified: Secondary | ICD-10-CM

## 2022-03-10 DIAGNOSIS — Z1231 Encounter for screening mammogram for malignant neoplasm of breast: Secondary | ICD-10-CM

## 2022-03-10 DIAGNOSIS — Z1211 Encounter for screening for malignant neoplasm of colon: Secondary | ICD-10-CM

## 2022-03-10 DIAGNOSIS — I10 Essential (primary) hypertension: Secondary | ICD-10-CM

## 2022-03-10 DIAGNOSIS — E785 Hyperlipidemia, unspecified: Secondary | ICD-10-CM

## 2022-03-10 DIAGNOSIS — R7303 Prediabetes: Secondary | ICD-10-CM

## 2022-03-10 DIAGNOSIS — R7989 Other specified abnormal findings of blood chemistry: Secondary | ICD-10-CM

## 2022-03-10 NOTE — Progress Notes (Signed)
Assessment & Plan:  Brandi [name] was seen today for prediabetes.  Diagnoses and all orders for this visit:  Prediabetes -     CMP14+EGFR -     Hemoglobin A1c  Dyslipidemia, goal LDL below 70 -     Lipid panel  Elevated TSH -     Thyroid Panel With TSH  Colon cancer screening -     Fecal occult blood, imunochemical([facility])  Vitamin D deficiency disease -     VITAMIN D 25 Hydroxy (Vit-D Deficiency, Fractures)  Blurred vision, bilateral -     Cancel: Ambulatory referral to Ophthalmology -     Ambulatory referral to Ophthalmology  Anemia, unspecified type -     CBC with Differential  Need for assessment by dentistry for poor dentition -     Ambulatory referral to Dentistry  Breast cancer screening by mammogram -     MS DIGITAL SCREENING TOMO BILATERAL; Future -     MM 3D SCREEN BREAST BILATERAL; Future    Patient has been counseled on age-appropriate routine health concerns for screening and prevention. These are reviewed and up-to-date. Referrals have been placed accordingly. Immunizations are up-to-date or declined.    Subjective:   Chief Complaint  Patient presents with   Prediabetes   HPI Brandi [name] 61 y.o. female presents to office today for follow up to prediabetes.   An onsite interpeter was present today to communicate directly with patient for the entire encounter including providing detailed patient instructions.    Endorses gradual onset of bilateral blurred vision. Overdue for an annual eye exam.   Notes aching and numbness in both hands and sometimes throughout her body after she gets off work. She works in a plant that [employer]   Blood pressure is controlled. She is currently not taking any antihypertensives. She has been prescribed lisinopril-hctz 10-12.5 mg and losartan 12.5 mg in the past but she never started taking either.  BP Readings from Last 3 Encounters:  03/10/22 138/84  01/21/22 (!) 163/88  01/18/21 (!) 141/89     Prediabetes Well controlled. LDL at goal.  Lab Results  Component Value Date   HGBA1C 5.8 (H) 03/10/2022    Lab Results  Component Value Date   LDLCALC 93 03/10/2022     Review of Systems  Constitutional:  Negative for fever, malaise/fatigue and weight loss.  HENT: Negative.  Negative for nosebleeds.   Eyes:  Positive for blurred vision. Negative for double vision and photophobia.  Respiratory: Negative.  Negative for cough and shortness of breath.   Cardiovascular: Negative.  Negative for chest pain, palpitations and leg swelling.  Gastrointestinal: Negative.  Negative for heartburn, nausea and vomiting.  Musculoskeletal:  Positive for myalgias.  Neurological: Negative.  Negative for dizziness, focal weakness, seizures and headaches.  Psychiatric/Behavioral: Negative.  Negative for suicidal ideas.     Past Medical History:  Diagnosis Date   Elevated BP without diagnosis of hypertension    GERD (gastroesophageal reflux disease)    Obesity     Past Surgical History:  Procedure Laterality Date   Arthroscopic Knee Surgery Right 2018   Repair of ligament or tendon damage from work related injury   [place]    Family History  Problem Relation Age of Onset   Stroke Mother        cause of death   Hypertension Mother    Cancer Maternal Aunt        Breast  Pneumonia Father    Hyperlipidemia Sister    Depression Sister    Depression Sister    Colitis Sister    Pneumonia Brother     Social History Reviewed with no changes to be made today.   Outpatient Medications Prior to Visit  Medication Sig Dispense Refill   fexofenadine (ALLEGRA) 60 MG tablet Take 60 mg by mouth 2 (two) times daily.     losartan (COZAAR) 25 MG tablet Take 0.5 tablets (12.5 mg total) by mouth daily. 15 tablet 0   No facility-administered medications prior to visit.    No Known Allergies     Objective:    BP 138/84   Pulse 80   Temp 97.9 F (36.6  C) (Temporal)   Ht 4' 8.5" (1.435 m)   Wt 191 lb 8 oz (86.9 kg)   LMP  (LMP Unknown)   SpO2 98%   BMI 42.18 kg/m  Wt Readings from Last 3 Encounters:  03/10/22 191 lb 8 oz (86.9 kg)  01/18/21 191 lb 2 oz (86.7 kg)  09/21/20 188 lb 6.4 oz (85.5 kg)    Physical Exam Vitals and nursing note reviewed.  Constitutional:      Appearance: She is well-developed.  HENT:     Head: Normocephalic and atraumatic.  Cardiovascular:     Rate and Rhythm: Normal rate and regular rhythm.     Heart sounds: Normal heart sounds. No murmur heard.    No friction rub. No gallop.  Pulmonary:     Effort: Pulmonary effort is normal. No tachypnea or respiratory distress.     Breath sounds: Normal breath sounds. No decreased breath sounds, wheezing, rhonchi or rales.  Chest:     Chest wall: No tenderness.  Abdominal:     General: Bowel sounds are normal.     Palpations: Abdomen is soft.  Musculoskeletal:        General: Normal range of motion.     Cervical back: Normal range of motion.  Skin:    General: Skin is warm and dry.  Neurological:     Mental Status: She is alert and oriented to person, place, and time.     Coordination: Coordination normal.  Psychiatric:        Behavior: Behavior normal. Behavior is cooperative.        Thought Content: Thought content normal.        Judgment: Judgment normal.          Patient has been counseled extensively about nutrition and exercise as well as the importance of adherence with medications and regular follow-up. The patient was given clear instructions to go to ER or return to medical center if symptoms don't improve, worsen or new problems develop. The patient verbalized understanding.   Follow-up: Return in about 3 months (around 06/10/2022).   Gildardo Pounds, FNP-BC [facility] [place], [place]   03/16/2022, 7:09 PM

## 2022-03-11 LAB — CBC WITH DIFFERENTIAL/PLATELET
Basophils Absolute: 0.1 10*3/uL (ref 0.0–0.2)
Basos: 1 %
EOS (ABSOLUTE): 0.1 10*3/uL (ref 0.0–0.4)
Eos: 1 %
Hematocrit: 41.2 % (ref 34.0–46.6)
Hemoglobin: 13.3 g/dL (ref 11.1–15.9)
Immature Grans (Abs): 0 10*3/uL (ref 0.0–0.1)
Immature Granulocytes: 0 %
Lymphocytes Absolute: 2 10*3/uL (ref 0.7–3.1)
Lymphs: 23 %
MCH: 28.9 pg (ref 26.6–33.0)
MCHC: 32.3 g/dL (ref 31.5–35.7)
MCV: 90 fL (ref 79–97)
Monocytes Absolute: 1.2 10*3/uL — ABNORMAL HIGH (ref 0.1–0.9)
Monocytes: 14 %
Neutrophils Absolute: 5.1 10*3/uL (ref 1.4–7.0)
Neutrophils: 61 %
Platelets: 221 10*3/uL (ref 150–450)
RBC: 4.6 x10E6/uL (ref 3.77–5.28)
RDW: 12.6 % (ref 11.7–15.4)
WBC: 8.4 10*3/uL (ref 3.4–10.8)

## 2022-03-11 LAB — CMP14+EGFR
ALT: 19 IU/L (ref 0–32)
AST: 22 IU/L (ref 0–40)
Albumin/Globulin Ratio: 1.1 — ABNORMAL LOW (ref 1.2–2.2)
Albumin: 4.1 g/dL (ref 3.8–4.9)
Alkaline Phosphatase: 115 IU/L (ref 44–121)
BUN/Creatinine Ratio: 16 (ref 12–28)
BUN: 12 mg/dL (ref 8–27)
Bilirubin Total: 0.5 mg/dL (ref 0.0–1.2)
CO2: 22 mmol/L (ref 20–29)
Calcium: 9.4 mg/dL (ref 8.7–10.3)
Chloride: 107 mmol/L — ABNORMAL HIGH (ref 96–106)
Creatinine, Ser: 0.77 mg/dL (ref 0.57–1.00)
Globulin, Total: 3.8 g/dL (ref 1.5–4.5)
Glucose: 80 mg/dL (ref 70–99)
Potassium: 4.3 mmol/L (ref 3.5–5.2)
Sodium: 142 mmol/L (ref 134–144)
Total Protein: 7.9 g/dL (ref 6.0–8.5)
eGFR: 88 mL/min/{1.73_m2} (ref >59)

## 2022-03-11 LAB — LIPID PANEL
Chol/HDL Ratio: 3.3 ratio (ref 0.0–4.4)
Cholesterol, Total: 189 mg/dL (ref 100–199)
HDL: 57 mg/dL (ref >39)
LDL Chol Calc (NIH): 93 mg/dL (ref 0–99)
Triglycerides: 235 mg/dL — ABNORMAL HIGH (ref 0–149)
VLDL Cholesterol Cal: 39 mg/dL (ref 5–40)

## 2022-03-11 LAB — THYROID PANEL WITH TSH
Free Thyroxine Index: 1.4 (ref 1.2–4.9)
T3 Uptake Ratio: 23 % — ABNORMAL LOW (ref 24–39)
T4, Total: 6.3 ug/dL (ref 4.5–12.0)
TSH: 7.88 u[IU]/mL — ABNORMAL HIGH (ref 0.450–4.500)

## 2022-03-11 LAB — VITAMIN D 25 HYDROXY (VIT D DEFICIENCY, FRACTURES): Vit D, 25-Hydroxy: 13.7 ng/mL — ABNORMAL LOW (ref 30.0–100.0)

## 2022-03-11 LAB — HEMOGLOBIN A1C
Est. average glucose Bld gHb Est-mCnc: 120 mg/dL
Hgb A1c MFr Bld: 5.8 % — ABNORMAL HIGH (ref 4.8–5.6)

## 2022-03-15 ENCOUNTER — Other Ambulatory Visit: Payer: Self-pay | Admitting: Nurse Practitioner

## 2022-03-15 DIAGNOSIS — E559 Vitamin D deficiency, unspecified: Secondary | ICD-10-CM

## 2022-03-15 DIAGNOSIS — E039 Hypothyroidism, unspecified: Secondary | ICD-10-CM

## 2022-03-15 MED ORDER — LEVOTHYROXINE SODIUM 25 MCG PO TABS
25.0000 ug | ORAL_TABLET | Freq: Every day | ORAL | 3 refills | Status: DC
  Filled 2022-03-15: qty 30, 30d supply, fill #0

## 2022-03-15 MED ORDER — VITAMIN D (ERGOCALCIFEROL) 1.25 MG (50000 UNIT) PO CAPS
50000.0000 [IU] | ORAL_CAPSULE | ORAL | 0 refills | Status: DC
  Filled 2022-03-15: qty 4, 28d supply, fill #0

## 2022-03-16 ENCOUNTER — Encounter: Payer: Self-pay | Admitting: Nurse Practitioner

## 2022-03-17 ENCOUNTER — Other Ambulatory Visit: Payer: Self-pay

## 2022-03-17 NOTE — Progress Notes (Signed)
Letter sent.

## 2022-03-24 ENCOUNTER — Other Ambulatory Visit: Payer: Self-pay

## 2022-04-22 ENCOUNTER — Other Ambulatory Visit: Payer: Self-pay

## 2022-04-30 ENCOUNTER — Telehealth: Payer: Self-pay | Admitting: Emergency Medicine

## 2022-04-30 NOTE — Telephone Encounter (Signed)
Copied from CRM [id number]. Topic: General - Inquiry >> Apr 30, 2022  4:22 PM Marlow Baars wrote: Reason for CRM: The patient called in inquiring about her blood work done in November. [place]ase contact her regarding her lab results. Please assist patient further

## 2022-04-30 NOTE — Telephone Encounter (Signed)
Unable to reach patient by phone. Vm left to return call.

## 2022-05-08 ENCOUNTER — Ambulatory Visit
Admission: RE | Admit: 2022-05-08 | Discharge: 2022-05-08 | Disposition: A | Discharge status: Home / Self Care | Payer: Self-pay | Source: Ambulatory Visit | Attending: Nurse Practitioner | Admitting: Nurse Practitioner

## 2022-05-08 DIAGNOSIS — Z1231 Encounter for screening mammogram for malignant neoplasm of breast: Secondary | ICD-10-CM

## 2022-06-10 ENCOUNTER — Ambulatory Visit: Payer: Self-pay | Attending: Nurse Practitioner | Admitting: Nurse Practitioner

## 2022-06-16 ENCOUNTER — Ambulatory Visit: Payer: Self-pay | Admitting: Nurse Practitioner

## 2022-09-05 ENCOUNTER — Ambulatory Visit: Payer: BLUE CROSS/BLUE SHIELD | Attending: Nurse Practitioner | Admitting: Nurse Practitioner

## 2022-09-05 ENCOUNTER — Encounter: Payer: Self-pay | Admitting: Nurse Practitioner

## 2022-09-05 ENCOUNTER — Other Ambulatory Visit: Payer: Self-pay

## 2022-09-05 VITALS — BP 132/82 | HR 82 | Ht <= 58 in | Wt 189.8 lb

## 2022-09-05 DIAGNOSIS — R3915 Urgency of urination: Secondary | ICD-10-CM | POA: Diagnosis not present

## 2022-09-05 DIAGNOSIS — M25512 Pain in left shoulder: Secondary | ICD-10-CM

## 2022-09-05 DIAGNOSIS — E039 Hypothyroidism, unspecified: Secondary | ICD-10-CM | POA: Diagnosis not present

## 2022-09-05 DIAGNOSIS — G8929 Other chronic pain: Secondary | ICD-10-CM

## 2022-09-05 DIAGNOSIS — I10 Essential (primary) hypertension: Secondary | ICD-10-CM

## 2022-09-05 DIAGNOSIS — Z6841 Body Mass Index (BMI) 40.0 and over, adult: Secondary | ICD-10-CM

## 2022-09-05 DIAGNOSIS — Z1211 Encounter for screening for malignant neoplasm of colon: Secondary | ICD-10-CM

## 2022-09-05 DIAGNOSIS — R7303 Prediabetes: Secondary | ICD-10-CM | POA: Diagnosis not present

## 2022-09-05 DIAGNOSIS — E559 Vitamin D deficiency, unspecified: Secondary | ICD-10-CM

## 2022-09-05 MED ORDER — MELOXICAM 7.5 MG PO TABS
7.5000 mg | ORAL_TABLET | Freq: Every day | ORAL | 3 refills | Status: DC
Start: 2022-09-05 — End: 2023-07-29
  Filled 2022-09-05: qty 30, 30d supply, fill #0
  Filled 2022-10-24: qty 30, 30d supply, fill #1
  Filled 2023-01-23: qty 30, 30d supply, fill #2
  Filled 2023-04-16: qty 30, 30d supply, fill #3

## 2022-09-05 MED ORDER — LEVOTHYROXINE SODIUM 25 MCG PO TABS
25.0000 ug | ORAL_TABLET | Freq: Every day | ORAL | 3 refills | Status: DC
  Filled 2022-09-05: qty 30, 30d supply, fill #0

## 2022-09-05 MED ORDER — VITAMIN D (ERGOCALCIFEROL) 1.25 MG (50000 UNIT) PO CAPS
50000.0000 [IU] | ORAL_CAPSULE | ORAL | 0 refills | Status: DC
  Filled 2022-09-05: qty 12, 84d supply, fill #0
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
## 2022-09-06 LAB — URINALYSIS, COMPLETE
Bilirubin, UA: NEGATIVE
Glucose, UA: NEGATIVE
Ketones, UA: NEGATIVE
Leukocytes,UA: NEGATIVE
Nitrite, UA: NEGATIVE
RBC, UA: NEGATIVE
Specific Gravity, UA: 1.015 (ref 1.005–1.030)
Urobilinogen, Ur: 0.2 mg/dL (ref 0.2–1.0)
pH, UA: 7.5 (ref 5.0–7.5)

## 2022-09-06 LAB — CMP14+EGFR
ALT: 27 IU/L (ref 0–32)
AST: 26 IU/L (ref 0–40)
Albumin/Globulin Ratio: 1 — ABNORMAL LOW (ref 1.2–2.2)
Albumin: 4.3 g/dL (ref 3.8–4.9)
Alkaline Phosphatase: 111 IU/L (ref 44–121)
BUN/Creatinine Ratio: 14 (ref 12–28)
BUN: 11 mg/dL (ref 8–27)
Bilirubin Total: 0.5 mg/dL (ref 0.0–1.2)
CO2: 21 mmol/L (ref 20–29)
Calcium: 9.4 mg/dL (ref 8.7–10.3)
Chloride: 104 mmol/L (ref 96–106)
Creatinine, Ser: 0.79 mg/dL (ref 0.57–1.00)
Globulin, Total: 4.5 g/dL (ref 1.5–4.5)
Glucose: 83 mg/dL (ref 70–99)
Potassium: 4.1 mmol/L (ref 3.5–5.2)
Sodium: 141 mmol/L (ref 134–144)
Total Protein: 8.8 g/dL — ABNORMAL HIGH (ref 6.0–8.5)
eGFR: 86 mL/min/{1.73_m2} (ref >59)

## 2022-09-06 LAB — THYROID PANEL WITH TSH
Free Thyroxine Index: 1.4 (ref 1.2–4.9)
T3 Uptake Ratio: 19 % — ABNORMAL LOW (ref 24–39)
T4, Total: 7.3 ug/dL (ref 4.5–12.0)
TSH: 5.14 u[IU]/mL — ABNORMAL HIGH (ref 0.450–4.500)

## 2022-09-06 LAB — HEMOGLOBIN A1C
Est. average glucose Bld gHb Est-mCnc: 120 mg/dL
Hgb A1c MFr Bld: 5.8 % — ABNORMAL HIGH (ref 4.8–5.6)

## 2022-09-06 LAB — MICROSCOPIC EXAMINATION
Bacteria, UA: NONE SEEN
Casts: NONE SEEN /lpf
Epithelial Cells (non renal): NONE SEEN /hpf (ref 0–10)
RBC, Urine: NONE SEEN /hpf (ref 0–2)
WBC, UA: NONE SEEN /hpf (ref 0–5)

## 2022-09-07 LAB — FECAL OCCULT BLOOD, IMMUNOCHEMICAL: Fecal Occult Bld: NEGATIVE

## 2022-10-03 ENCOUNTER — Ambulatory Visit: Payer: BLUE CROSS/BLUE SHIELD | Attending: Nurse Practitioner

## 2022-10-03 DIAGNOSIS — E039 Hypothyroidism, unspecified: Secondary | ICD-10-CM

## 2022-10-04 LAB — TSH+T4F+T3FREE
Free T4: 1 ng/dL (ref 0.82–1.77)
T3, Free: 3 pg/mL (ref 2.0–4.4)
TSH: 6.53 u[IU]/mL — ABNORMAL HIGH (ref 0.450–4.500)

## 2022-10-08 ENCOUNTER — Other Ambulatory Visit: Payer: Self-pay | Admitting: Nurse Practitioner

## 2022-10-08 ENCOUNTER — Other Ambulatory Visit: Payer: Self-pay

## 2022-10-08 DIAGNOSIS — E039 Hypothyroidism, unspecified: Secondary | ICD-10-CM

## 2022-10-08 MED ORDER — LEVOTHYROXINE SODIUM 50 MCG PO TABS
50.0000 ug | ORAL_TABLET | Freq: Every day | ORAL | 1 refills | Status: DC
Start: 2022-10-08 — End: 2022-11-28
  Filled 2022-10-08 – 2022-10-24 (×2): qty 30, 30d supply, fill #0

## 2022-10-14 ENCOUNTER — Other Ambulatory Visit: Payer: Self-pay

## 2022-10-17 ENCOUNTER — Ambulatory Visit: Payer: BLUE CROSS/BLUE SHIELD | Attending: Nurse Practitioner

## 2022-10-17 DIAGNOSIS — H6123 Impacted cerumen, bilateral: Secondary | ICD-10-CM

## 2022-10-17 NOTE — Progress Notes (Deleted)
Ceruminosis is noted.  Wax is removed by syringing and manual debridement in the left and right ear. Instructions for home care to prevent wax buildup are given.

## 2022-10-17 NOTE — Progress Notes (Signed)
Ceruminosis is noted.  Wax is removed by syringing and manual debridement in the left and right ear. Instructions for home care to prevent wax buildup are given.  

## 2022-10-24 ENCOUNTER — Other Ambulatory Visit: Payer: Self-pay | Admitting: Nurse Practitioner

## 2022-10-24 ENCOUNTER — Other Ambulatory Visit: Payer: Self-pay

## 2022-10-24 DIAGNOSIS — E559 Vitamin D deficiency, unspecified: Secondary | ICD-10-CM

## 2022-10-24 MED ORDER — VITAMIN D (ERGOCALCIFEROL) 1.25 MG (50000 UNIT) PO CAPS
50000.0000 [IU] | ORAL_CAPSULE | ORAL | 0 refills | Status: DC
Start: 2022-10-24 — End: 2023-05-19
  Filled 2022-10-24 – 2023-01-23 (×2): qty 12, 84d supply, fill #0

## 2022-11-19 ENCOUNTER — Other Ambulatory Visit: Payer: Self-pay | Admitting: Nurse Practitioner

## 2022-11-19 ENCOUNTER — Other Ambulatory Visit: Payer: BLUE CROSS/BLUE SHIELD

## 2022-11-19 ENCOUNTER — Ambulatory Visit: Payer: BLUE CROSS/BLUE SHIELD | Attending: Nurse Practitioner

## 2022-11-19 DIAGNOSIS — E039 Hypothyroidism, unspecified: Secondary | ICD-10-CM

## 2022-11-20 LAB — THYROID PANEL WITH TSH
Free Thyroxine Index: 2.1 (ref 1.2–4.9)
T3 Uptake Ratio: 23 % — ABNORMAL LOW (ref 24–39)
T4, Total: 9.1 ug/dL (ref 4.5–12.0)
TSH: 3.02 u[IU]/mL (ref 0.450–4.500)

## 2022-11-28 ENCOUNTER — Other Ambulatory Visit: Payer: Self-pay

## 2022-11-28 ENCOUNTER — Other Ambulatory Visit: Payer: Self-pay | Admitting: Pharmacist

## 2022-11-28 DIAGNOSIS — E039 Hypothyroidism, unspecified: Secondary | ICD-10-CM

## 2022-11-28 MED ORDER — LEVOTHYROXINE SODIUM 50 MCG PO TABS
50.0000 ug | ORAL_TABLET | Freq: Every day | ORAL | 1 refills | Status: DC
Start: 2022-11-28 — End: 2023-02-06
  Filled 2022-11-28: qty 30, 30d supply, fill #0
  Filled 2023-01-02: qty 30, 30d supply, fill #1

## 2023-01-02 ENCOUNTER — Other Ambulatory Visit: Payer: Self-pay

## 2023-01-20 ENCOUNTER — Ambulatory Visit: Payer: BLUE CROSS/BLUE SHIELD | Admitting: Nurse Practitioner

## 2023-01-23 ENCOUNTER — Other Ambulatory Visit: Payer: Self-pay

## 2023-02-06 ENCOUNTER — Other Ambulatory Visit: Payer: Self-pay | Admitting: Family Medicine

## 2023-02-06 ENCOUNTER — Other Ambulatory Visit: Payer: Self-pay

## 2023-02-06 DIAGNOSIS — E039 Hypothyroidism, unspecified: Secondary | ICD-10-CM

## 2023-02-06 MED ORDER — LEVOTHYROXINE SODIUM 50 MCG PO TABS
50.0000 ug | ORAL_TABLET | Freq: Every day | ORAL | 1 refills | Status: DC
  Filled 2023-02-06: qty 90, 90d supply, fill #0
  Filled 2023-05-19: qty 90, 90d supply, fill #1

## 2023-02-06 NOTE — Telephone Encounter (Signed)
Requested Prescriptions  Pending Prescriptions Disp Refills   levothyroxine (SYNTHROID) 50 MCG tablet 90 tablet 1    Sig: Take 1 tablet (50 mcg total) by mouth daily. FOR THYROID     Endocrinology:  Hypothyroid Agents Passed - 02/06/2023  1:55 PM      Passed - TSH in normal range and within 360 days    TSH  Date Value Ref Range Status  11/19/2022 3.020 0.450 - 4.500 uIU/mL Final         Passed - Valid encounter within last 12 months    Recent Outpatient Visits           3 months ago Impacted cerumen of both ears   [place] [facility] [place], [place], [place]   5 months ago Acquired hypothyroidism   [place] [facility] [place], Shea Stakes, NP   11 months ago Prediabetes   [facility] [place], Shea Stakes, NP   2 years ago Encounter for Papanicolaou smear for cervical cancer screening   [facility] [place], Shea Stakes, NP   2 years ago Encounter for annual physical exam   [facility] [place] Claiborne Rigg, NP       Future Appointments             In 2 weeks Claiborne Rigg, NP [facility]

## 2023-02-09 ENCOUNTER — Other Ambulatory Visit: Payer: Self-pay

## 2023-02-23 ENCOUNTER — Ambulatory Visit: Payer: Self-pay | Admitting: Nurse Practitioner

## 2023-04-16 ENCOUNTER — Other Ambulatory Visit: Payer: Self-pay | Admitting: Nurse Practitioner

## 2023-04-16 ENCOUNTER — Other Ambulatory Visit: Payer: Self-pay

## 2023-04-16 DIAGNOSIS — E559 Vitamin D deficiency, unspecified: Secondary | ICD-10-CM

## 2023-04-16 NOTE — Telephone Encounter (Signed)
Requested medication (s) are due for refill today:   Provider to review  Requested medication (s) are on the active medication list:   Yes  Future visit scheduled:   Yes 05/19/2023 with Zelda   Last ordered: 10/24/2022 #12, 0 refills  Non delegated refill     Requested Prescriptions  Pending Prescriptions Disp Refills   Vitamin D, Ergocalciferol, (DRISDOL) 1.25 MG (50000 UNIT) CAPS capsule 12 capsule 0    Sig: Take 1 capsule (50,000 Units total) by mouth every 7 (seven) days. For low vitamin D     Endocrinology:  Vitamins - Vitamin D Supplementation 2 Failed - 04/16/2023  3:41 PM      Failed - Manual Review: Route requests for 50,000 IU strength to the provider      Failed - Vitamin D in normal range and within 360 days    Vit D, 25-Hydroxy  Date Value Ref Range Status  03/10/2022 13.7 (L) 30.0 - 100.0 ng/mL Final    Comment:    Vitamin D deficiency has been defined by the Institute of Medicine and an Endocrine Society practice guideline as a level of serum 25-OH vitamin D less than 20 ng/mL (1,2). The Endocrine Society went on to further define vitamin D insufficiency as a level between 21 and 29 ng/mL (2). 1. IOM (Institute of Medicine). 2010. Dietary reference    intakes for calcium and D. [place]: The    [employer]. 2. Holick MF, Binkley [place], Bischoff-Ferrari HA, et al.    Evaluation, treatment, and prevention of vitamin D    deficiency: an Endocrine Society clinical practice    guideline. JCEM. 2011 Jul; 96(7):1911-30.          Passed - Ca in normal range and within 360 days    Calcium  Date Value Ref Range Status  09/05/2022 9.4 8.7 - 10.3 mg/dL Final         Passed - Valid encounter within last 12 months    Recent Outpatient Visits           6 months ago Impacted cerumen of both ears   [facility] [place]. [facility] Elsie Lincoln F, [place]   7 months ago Acquired hypothyroidism   [facility] [place]. [facility] Claiborne Rigg, NP   1 year ago Prediabetes   [facility] [place]. [facility] Claiborne Rigg, NP   2 years ago Encounter for Papanicolaou smear for cervical cancer screening   [place] Of [place]. [facility] Claiborne Rigg, NP   2 years ago Encounter for annual physical exam    [facility] [place]. [facility] Claiborne Rigg, NP       Future Appointments             In 1 month Claiborne Rigg, NP [facility] [place]. [facility]

## 2023-04-24 ENCOUNTER — Other Ambulatory Visit: Payer: Self-pay

## 2023-05-05 ENCOUNTER — Ambulatory Visit: Payer: Self-pay | Admitting: Nurse Practitioner

## 2023-05-13 ENCOUNTER — Emergency Department (HOSPITAL_BASED_OUTPATIENT_CLINIC_OR_DEPARTMENT_OTHER)
Admission: EM | Admit: 2023-05-13 | Discharge: 2023-05-13 | Disposition: A | Discharge status: Home / Self Care | Payer: Medicaid Other | Attending: Emergency Medicine | Admitting: Emergency Medicine

## 2023-05-13 ENCOUNTER — Encounter (HOSPITAL_BASED_OUTPATIENT_CLINIC_OR_DEPARTMENT_OTHER): Payer: Self-pay

## 2023-05-13 ENCOUNTER — Emergency Department (HOSPITAL_BASED_OUTPATIENT_CLINIC_OR_DEPARTMENT_OTHER): Payer: Medicaid Other | Admitting: Radiology

## 2023-05-13 ENCOUNTER — Other Ambulatory Visit: Payer: Self-pay

## 2023-05-13 ENCOUNTER — Other Ambulatory Visit (HOSPITAL_BASED_OUTPATIENT_CLINIC_OR_DEPARTMENT_OTHER): Payer: Self-pay

## 2023-05-13 DIAGNOSIS — Z1152 Encounter for screening for COVID-19: Secondary | ICD-10-CM | POA: Insufficient documentation

## 2023-05-13 DIAGNOSIS — R0981 Nasal congestion: Secondary | ICD-10-CM | POA: Insufficient documentation

## 2023-05-13 DIAGNOSIS — R0602 Shortness of breath: Secondary | ICD-10-CM | POA: Insufficient documentation

## 2023-05-13 DIAGNOSIS — R059 Cough, unspecified: Secondary | ICD-10-CM | POA: Diagnosis not present

## 2023-05-13 DIAGNOSIS — Z79899 Other long term (current) drug therapy: Secondary | ICD-10-CM | POA: Diagnosis not present

## 2023-05-13 DIAGNOSIS — I10 Essential (primary) hypertension: Secondary | ICD-10-CM | POA: Insufficient documentation

## 2023-05-13 DIAGNOSIS — E039 Hypothyroidism, unspecified: Secondary | ICD-10-CM | POA: Diagnosis not present

## 2023-05-13 LAB — CBC
HCT: 38.1 % (ref 36.0–46.0)
Hemoglobin: 13.2 g/dL (ref 12.0–15.0)
MCH: 29.5 pg (ref 26.0–34.0)
MCHC: 34.6 g/dL (ref 30.0–36.0)
MCV: 85 fL (ref 80.0–100.0)
Platelets: 228 10*3/uL (ref 150–400)
RBC: 4.48 MIL/uL (ref 3.87–5.11)
RDW: 12.4 % (ref 11.5–15.5)
WBC: 10.4 10*3/uL (ref 4.0–10.5)
nRBC: 0 % (ref 0.0–0.2)

## 2023-05-13 LAB — BASIC METABOLIC PANEL
Anion gap: 8 (ref 5–15)
BUN: 14 mg/dL (ref 8–23)
CO2: 25 mmol/L (ref 22–32)
Calcium: 9.2 mg/dL (ref 8.9–10.3)
Chloride: 104 mmol/L (ref 98–111)
Creatinine, Ser: 0.92 mg/dL (ref 0.44–1.00)
GFR, Estimated: >60 mL/min (ref >60)
Glucose, Bld: 112 mg/dL — ABNORMAL HIGH (ref 70–99)
Potassium: 4.2 mmol/L (ref 3.5–5.1)
Sodium: 137 mmol/L (ref 135–145)

## 2023-05-13 LAB — TROPONIN I (HIGH SENSITIVITY): Troponin I (High Sensitivity): 3 ng/L (ref <18)

## 2023-05-13 LAB — RESP PANEL BY RT-PCR (RSV, FLU A&B, COVID)  RVPGX2
Influenza A by PCR: NEGATIVE
Influenza B by PCR: NEGATIVE
Resp Syncytial Virus by PCR: NEGATIVE
SARS Coronavirus 2 by RT PCR: NEGATIVE

## 2023-05-13 MED ORDER — ALBUTEROL SULFATE HFA 108 (90 BASE) MCG/ACT IN AERS: 1.0000 | INHALATION_SPRAY | Freq: Four times a day (QID) | RESPIRATORY_TRACT | 0 refills | Status: DC | PRN

## 2023-05-13 MED ORDER — ALBUTEROL SULFATE HFA 108 (90 BASE) MCG/ACT IN AERS
1.0000 | INHALATION_SPRAY | Freq: Once | RESPIRATORY_TRACT | Status: AC
Start: 2023-05-13 — End: 2023-05-13
  Administered 2023-05-13: 2 via RESPIRATORY_TRACT
  Filled 2023-05-13: qty 6.7

## 2023-05-13 MED ORDER — BENZONATATE 100 MG PO CAPS: 100.0000 mg | ORAL_CAPSULE | Freq: Three times a day (TID) | ORAL | 0 refills | Status: DC | PRN

## 2023-05-13 MED ORDER — TRIAMCINOLONE ACETONIDE 55 MCG/ACT NA AERO: 2.0000 | INHALATION_SPRAY | Freq: Every day | NASAL | 12 refills | Status: DC

## 2023-05-13 MED ORDER — DOXYCYCLINE HYCLATE 100 MG PO CAPS: 100.0000 mg | ORAL_CAPSULE | Freq: Two times a day (BID) | ORAL | 0 refills | Status: DC

## 2023-05-13 MED ORDER — PREDNISONE 50 MG PO TABS
60.0000 mg | ORAL_TABLET | Freq: Once | ORAL | Status: AC
  Administered 2023-05-13: 60 mg via ORAL
  Filled 2023-05-13: qty 1

## 2023-05-13 MED ORDER — IPRATROPIUM BROMIDE 0.02 % IN SOLN
0.5000 mg | Freq: Once | RESPIRATORY_TRACT | Status: AC
  Administered 2023-05-13: 0.5 mg via RESPIRATORY_TRACT
  Filled 2023-05-13: qty 2.5

## 2023-05-13 MED ORDER — ALBUTEROL SULFATE (2.5 MG/3ML) 0.083% IN NEBU
5.0000 mg | INHALATION_SOLUTION | Freq: Once | RESPIRATORY_TRACT | Status: AC
  Administered 2023-05-13: 5 mg via RESPIRATORY_TRACT
  Filled 2023-05-13: qty 6

## 2023-05-13 MED ORDER — PREDNISONE 20 MG PO TABS: 40.0000 mg | ORAL_TABLET | Freq: Every day | ORAL | 0 refills | Status: AC

## 2023-05-13 NOTE — Discharge Instructions (Addendum)
 Como ya hemos comentado, el anlisis de hoy es, en general, tranquilizador.  Tu prueba fue negativa para COVID, gripe, RSV.  La radiografa de trax no mostr neumona evidente.  Sospeche que sus sntomas probablemente sean secundarios a un proceso viral pero a una inflamacin secundaria de sus pulmones.  Se tratar con un inhalador continuo de albuterol , as como con un ciclo corto de esteroides para ayudar con la respiracin.  Recomiende un seguimiento estrecho con su atencin primaria para una reevaluacin.  No dude en volver si los signos y sntomas preocupantes que comentamos se vuelven evidentes.  As discussed, workup today overall reassuring.  You tested negative for COVID, flu, RSV.  Chest x-ray was without obvious pneumonia.  Suspect your symptoms likely secondary to viral process but secondary inflammation of your lungs.  Will treat with continues of albuterol  inhaler as well as short course of steroids to help with your breathing.  Recommend close follow-up with your primary care for reassessment.  Please do not hesitate to return if the worrisome signs and symptoms we discussed become apparent.

## 2023-05-13 NOTE — ED Provider Notes (Signed)
 [place] EMERGENCY DEPARTMENT AT [facility] Provider Note   CSN: [id number] Arrival date & time: 05/13/23  1626     History  Chief Complaint  Patient presents with   Shortness of Breath    Brandi [name] is a 63 y.o. female.   Shortness of Breath   63 year old female presents to the ED with complaints of cough, congestion, shortness of breath.  Patient states the symptoms began on 12/31 with bodyaches, fevers, chills, cough.  States that most of symptoms have resolved since then but is still been with cough, nasal congestion and feelings of shortness of breath.  Patient states that she feels as if she is wheezing.  Denies any recent fever, chills.  Denies any chest pain, abdominal pain, nausea, vomiting, urinary symptoms, change in bowel habits.  States that she has been taking Motrin  which initially helped with the body aches but has not been helpful with her other symptoms once of bodyaches resolved.  Reports multiple potential sick exposures.  Past medical history significant for hypertension, obesity, hypothyroidism  Home Medications Prior to Admission medications   Medication Sig Start Date End Date Taking? Authorizing Provider  albuterol  (VENTOLIN  HFA) 108 (90 Base) MCG/ACT inhaler Inhale 1-2 puffs into the lungs every 6 (six) hours as needed for wheezing or shortness of breath. 05/13/23  Yes Silver Fell A, PA  benzonatate  (TESSALON ) 100 MG capsule Take 1 capsule (100 mg total) by mouth 3 (three) times daily as needed. 05/13/23  Yes Silver Fell A, PA  predniSONE  (DELTASONE ) 20 MG tablet Take 2 tablets (40 mg total) by mouth daily with breakfast for 5 days. 05/14/23 05/19/23 Yes Silver Fell A, PA  triamcinolone  (NASACORT ) 55 MCG/ACT AERO nasal inhaler Place 2 sprays into the nose daily. 05/13/23  Yes Silver Fell A, PA  fexofenadine (ALLEGRA) 60 MG tablet Take 60 mg by mouth 2 (two) times daily.    [provider]  levothyroxine  (SYNTHROID ) 50 MCG  tablet Take 1 tablet (50 mcg total) by mouth daily. FOR THYROID  02/06/23   Theotis Haze ORN, NP  meloxicam  (MOBIC ) 7.5 MG tablet Take 1 tablet (7.5 mg total) by mouth daily. For shoulder pain 09/05/22   Fleming, Zelda W, NP  Vitamin D , Ergocalciferol , (DRISDOL ) 1.25 MG (50000 UNIT) CAPS capsule Take 1 capsule (50,000 Units total) by mouth every 7 (seven) days. For low vitamin D  10/24/22   Theotis Haze ORN, NP      Allergies    Patient has no known allergies.    Review of Systems   Review of Systems  Respiratory:  Positive for shortness of breath.   All other systems reviewed and are negative.   Physical Exam Updated Vital Signs BP 135/89   Pulse 91   Temp 98.2 F (36.8 C) (Oral)   Resp 20   Wt 86.2 kg   LMP  (LMP Unknown)   SpO2 97%   BMI 42.60 kg/m  Physical Exam Vitals and nursing note reviewed.  Constitutional:      General: She is not in acute distress.    Appearance: She is well-developed.  HENT:     Head: Normocephalic and atraumatic.     Right Ear: Tympanic membrane normal.     Left Ear: Tympanic membrane normal.     Nose: Congestion and rhinorrhea present.     Mouth/Throat:     Mouth: Mucous membranes are dry.     Pharynx: No posterior oropharyngeal erythema.  Eyes:     Conjunctiva/sclera: Conjunctivae normal.  Cardiovascular:     Rate and Rhythm: Normal rate and regular rhythm.     Pulses: Normal pulses.     Heart sounds: No murmur heard. Pulmonary:     Effort: Pulmonary effort is normal. No respiratory distress.     Breath sounds: Wheezing and rhonchi present. No rales.  Abdominal:     Palpations: Abdomen is soft.     Tenderness: There is no abdominal tenderness. There is no guarding.  Musculoskeletal:        General: No swelling.     Cervical back: Neck supple.     Right lower leg: No edema.     Left lower leg: No edema.  Skin:    General: Skin is warm and dry.     Capillary Refill: Capillary refill takes less than 2 seconds.  Neurological:      Mental Status: She is alert.  Psychiatric:        Mood and Affect: Mood normal.     ED Results / Procedures / Treatments   Labs (all labs ordered are listed, but only abnormal results are displayed) Labs Reviewed  BASIC METABOLIC PANEL - Abnormal; Notable for the following components:      Result Value   Glucose, Bld 112 (*)    All other components within normal limits  RESP PANEL BY RT-PCR (RSV, FLU A&B, COVID)  RVPGX2  CBC  TROPONIN I (HIGH SENSITIVITY)    EKG None  Radiology DG Chest 2 View Result Date: 05/13/2023 CLINICAL DATA:  Short of breath EXAM: CHEST - 2 VIEW COMPARISON:  None Available. FINDINGS: Normal cardiac silhouette. Mild coarsened central bronchovascular markings. No focal consolidation. No pneumothorax. Degenerative changes of the LEFT shoulder. IMPRESSION: Mild bronchiolitis.  No evidence of pneumonia. Electronically Signed   By: Jackquline Boxer M.D.   On: 05/13/2023 18:19    Procedures Procedures    Medications Ordered in ED Medications  ipratropium (ATROVENT ) nebulizer solution 0.5 mg (0.5 mg Nebulization Given 05/13/23 1713)  albuterol  (PROVENTIL ) (2.5 MG/3ML) 0.083% nebulizer solution 5 mg (5 mg Nebulization Given 05/13/23 1713)  albuterol  (VENTOLIN  HFA) 108 (90 Base) MCG/ACT inhaler 1-2 puff (2 puffs Inhalation Given 05/13/23 1829)  predniSONE  (DELTASONE ) tablet 60 mg (60 mg Oral Given 05/13/23 1834)    ED Course/ Medical Decision Making/ A&P                                 Medical Decision Making Amount and/or Complexity of Data Reviewed Labs: ordered. Radiology: ordered.  Risk OTC drugs. Prescription drug management.   This patient presents to the ED for concern of cough, congestion, shortness of breath, this involves an extensive number of treatment options, and is a complaint that carries with it a high risk of complications and morbidity.  The differential diagnosis includes viral URI, COVID, flu, RSV, pneumonia, CHF, ACS, PE, pneumothorax  asthma/COPD, bronchitis, other   Co morbidities that complicate the patient evaluation  See HPI   Additional history obtained:  Additional history obtained from EMR External records from outside source obtained and reviewed including see above   Lab Tests:  I Ordered, and personally interpreted labs.  The pertinent results include: No leukocytosis.  No evidence of anemia.  Placed within range.  No electrolyte abnormalities.  No renal dysfunction.  Troponin of 3.  Respiratory viral panel negative.   Imaging Studies ordered:  I ordered imaging studies including chest x-ray I independently visualized and interpreted  imaging which showed mild bronchiolitis.  No evidence of pneumonia. I agree with the radiologist interpretation   Cardiac Monitoring: / EKG:  The patient was maintained on a cardiac monitor.  I personally viewed and interpreted the cardiac monitored which showed an underlying rhythm of: Normal sinus rhythm   Consultations Obtained:  N/a   Problem List / ED Course / Critical interventions / Medication management  Cough, nasal congestion, shortness of breath I ordered medication including albuterol , prednisone , Atrovent   Reevaluation of the patient after these medicines showed that the patient improved I have reviewed the patients home medicines and have made adjustments as needed   Social Determinants of Health:  Denies tobacco, illicit drug use   Test / Admission - Considered:  Cough, nasal congestion, shortness of breath Vitals signs within normal range and stable throughout visit. Laboratory/imaging studies significant for: See above 63 year old female presents emergency department with complaints of cough, congestion, shortness of breath.  Patient has been ill with intermittent symptoms since 12/31 but with still wheezing, shortness of breath, cough.  Initially on exam, diffuse wheeze/rhonchi present without obvious Rales or appreciable decreased lung  sounds concerning for consolidation.  Labs obtained by triage staff reassuring.  Patient with negative troponin, lack of acute ischemic change on EKG; low suspicion for ACS.  Patient with low PESI score; low suspicion for PE.  Patient without chest pain rating to back, pulse deficits, neurologic deficits, hypertension; low suspicion for aortic dissection.  Second troponin deemed unnecessary given duration of patient's symptoms as well as more aligning with respiratory infection than ACS.  Viral testing negative.  Chest x-ray obtained which did not show evidence of consolidation, pulmonary vas congestion, pneumothorax but was with evidence of bronchiolitis.  Patient treated with multiple nebulized breathing therapies and did note significant improvement of breathing.  Suspect viral syndrome causing bronchiolitis as etiology of patient's symptoms.  Will treat with course of corticosteroids, continued albuterol  therapy as well as further symptomatic therapy as described in AVS.  Close follow-up with primary care recommended for reevaluation.  Treatment plan discussed at length with patient and she acknowledged understanding was agreeable to said plan.  Patient overall well-appearing, afebrile, nonhypoxic, in no acute distress. Worrisome signs and symptoms were discussed with the patient, and the patient acknowledged understanding to return to the ED if noticed. Patient was stable upon discharge.          Final Clinical Impression(s) / ED Diagnoses Final diagnoses:  Cough, unspecified type  Shortness of breath  Nasal congestion    Rx / DC Orders      Silver Wonda LABOR, PA 05/14/23 1051    Jerral Meth, MD 05/15/23 1453

## 2023-05-13 NOTE — ED Triage Notes (Signed)
 Pt arrives with c/o cough, congestion, and SOB. Pt had viral symptoms that started around 12/31 and has progressed into SOB. Pt reports back pain, fatigue, and fevers. Pt denies CP.

## 2023-05-19 ENCOUNTER — Other Ambulatory Visit: Payer: Self-pay

## 2023-05-19 ENCOUNTER — Encounter: Payer: Self-pay | Admitting: Nurse Practitioner

## 2023-05-19 ENCOUNTER — Ambulatory Visit: Payer: Medicaid Other | Attending: Nurse Practitioner | Admitting: Nurse Practitioner

## 2023-05-19 VITALS — BP 127/79 | HR 76 | Resp 20 | Ht <= 58 in | Wt 196.2 lb

## 2023-05-19 DIAGNOSIS — M25531 Pain in right wrist: Secondary | ICD-10-CM

## 2023-05-19 DIAGNOSIS — Z1231 Encounter for screening mammogram for malignant neoplasm of breast: Secondary | ICD-10-CM | POA: Diagnosis not present

## 2023-05-19 DIAGNOSIS — M25532 Pain in left wrist: Secondary | ICD-10-CM

## 2023-05-19 DIAGNOSIS — E039 Hypothyroidism, unspecified: Secondary | ICD-10-CM | POA: Diagnosis not present

## 2023-05-19 DIAGNOSIS — E559 Vitamin D deficiency, unspecified: Secondary | ICD-10-CM | POA: Diagnosis not present

## 2023-05-19 MED ORDER — TRAMADOL HCL 50 MG PO TABS
50.0000 mg | ORAL_TABLET | Freq: Three times a day (TID) | ORAL | 0 refills | Status: DC | PRN
  Filled 2023-05-19: qty 30, 10d supply, fill #0

## 2023-05-19 MED ORDER — VITAMIN D (ERGOCALCIFEROL) 1.25 MG (50000 UNIT) PO CAPS
50000.0000 [IU] | ORAL_CAPSULE | ORAL | 0 refills | Status: DC
  Filled 2023-05-19: qty 12, 84d supply, fill #0

## 2023-05-19 NOTE — Progress Notes (Addendum)
 Assessment & Plan:  Brandi [name] was seen today for hypothyroidism.  Diagnoses and all orders for this visit:  Bilateral wrist pain May try bilateral hand splints  -     DG Wrist Complete Left; Future -     DG Wrist Complete Right; Future -     Ambulatory referral to Hand Surgery -     traMADol  (ULTRAM ) 50 MG tablet; Take 1 tablet (50 mg total) by mouth every 8 (eight) hours as needed.  Breast cancer screening by mammogram -     MS 3D SCR MAMMO BILAT BR (aka MM); Future  Acquired hypothyroidism -     Thyroid  Panel With TSH  Vitamin D  deficiency disease -     VITAMIN D  25 Hydroxy (Vit-D Deficiency, Fractures) -     Vitamin D , Ergocalciferol , (DRISDOL ) 1.25 MG (50000 UNIT) CAPS capsule; Take 1 capsule (50,000 Units total) by mouth every 7 (seven) days. For low vitamin D     Patient has been counseled on age-appropriate routine health concerns for screening and prevention. These are reviewed and up-to-date. Referrals have been placed accordingly. Immunizations are up-to-date or declined.    Subjective:   Chief Complaint  Patient presents with   Hypothyroidism    Brandi [name] 63 y.o. female presents to office today for follow up to hypothyroidism.   VRI was used to communicate directly with patient for the entire encounter including providing detailed patient instructions.    She endorses chronic fatigue. Drinking only 2-3 water bottles per day. She is not taking vitamin D  and has a history of vitamin D  deficiency.   She is feeling pain in both wrists. Onset 2 years ago. 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 She had to stop working due to her hand pain April 22, 2022. She has been taking MELOXICAM  with no relief of pain. Pain is described as a pinching sensation and like the bones are touching each other. She also has chronic back pain. BMI 43.99  Review of Systems  Constitutional:  Positive for malaise/fatigue. Negative for fever and weight loss.  HENT:  Negative.  Negative for nosebleeds.   Eyes: Negative.  Negative for blurred vision, double vision and photophobia.  Respiratory: Negative.  Negative for cough and shortness of breath.   Cardiovascular: Negative.  Negative for chest pain, palpitations and leg swelling.  Gastrointestinal: Negative.  Negative for heartburn, nausea and vomiting.  Musculoskeletal:  Positive for back pain and joint pain. Negative for myalgias.  Neurological: Negative.  Negative for dizziness, focal weakness, seizures and headaches.  Psychiatric/Behavioral: Negative.  Negative for suicidal ideas.     Past Medical History:  Diagnosis Date   Elevated BP without diagnosis of hypertension    GERD (gastroesophageal reflux disease)    Obesity     Past Surgical History:  Procedure Laterality Date   Arthroscopic Knee Surgery Right 2018   Repair of ligament or tendon damage from work related injury   CESAREAN SECTION  1990   TUBAL LIGATION  1993    Family History  Problem Relation Age of Onset   Stroke Mother        cause of death   Hypertension Mother    Cancer Maternal Aunt        Breast   Pneumonia Father    Hyperlipidemia Sister    Depression Sister    Depression Sister    Colitis Sister    Pneumonia Brother     Social History Reviewed with no changes  to be made today.   Outpatient Medications Prior to Visit  Medication Sig Dispense Refill   albuterol  (VENTOLIN  HFA) 108 (90 Base) MCG/ACT inhaler Inhale 1-2 puffs into the lungs every 6 (six) hours as needed for wheezing or shortness of breath. 18 g 0   benzonatate  (TESSALON ) 100 MG capsule Take 1 capsule (100 mg total) by mouth 3 (three) times daily as needed. 21 capsule 0   fexofenadine (ALLEGRA) 60 MG tablet Take 60 mg by mouth 2 (two) times daily.     levothyroxine  (SYNTHROID ) 50 MCG tablet Take 1 tablet (50 mcg total) by mouth daily. FOR THYROID  90 tablet 1   meloxicam  (MOBIC ) 7.5 MG tablet Take 1 tablet (7.5 mg total) by mouth daily. For  shoulder pain 30 tablet 3   predniSONE  (DELTASONE ) 20 MG tablet Take 2 tablets (40 mg total) by mouth daily with breakfast for 5 days. 10 tablet 0   triamcinolone  (NASACORT ) 55 MCG/ACT AERO nasal inhaler Place 2 sprays into the nose daily. 1 each 12   Vitamin D , Ergocalciferol , (DRISDOL ) 1.25 MG (50000 UNIT) CAPS capsule Take 1 capsule (50,000 Units total) by mouth every 7 (seven) days. For low vitamin D  12 capsule 0   No facility-administered medications prior to visit.    No Known Allergies     Objective:    BP 127/79 (BP Location: Left Arm, Patient Position: Sitting, Cuff Size: Large)   Pulse 76   Resp 20   Ht 4' 8 (1.422 m)   Wt 196 lb 3.2 oz (89 kg)   LMP  (LMP Unknown)   SpO2 96%   BMI 43.99 kg/m  Wt Readings from Last 3 Encounters:  05/19/23 196 lb 3.2 oz (89 kg)  05/13/23 190 lb (86.2 kg)  09/05/22 189 lb 12.8 oz (86.1 kg)    Physical Exam Vitals and nursing note reviewed.  Constitutional:      Appearance: She is well-developed.  HENT:     Head: Normocephalic and atraumatic.  Cardiovascular:     Rate and Rhythm: Normal rate and regular rhythm.     Heart sounds: Normal heart sounds. No murmur heard.    No friction rub. No gallop.  Pulmonary:     Effort: Pulmonary effort is normal. No tachypnea or respiratory distress.     Breath sounds: Normal breath sounds. No decreased breath sounds, wheezing, rhonchi or rales.  Chest:     Chest wall: No tenderness.  Abdominal:     General: Bowel sounds are normal.     Palpations: Abdomen is soft.  Musculoskeletal:        General: Normal range of motion.     Cervical back: Normal range of motion.  Skin:    General: Skin is warm and dry.  Neurological:     Mental Status: She is alert and oriented to person, place, and time.     Coordination: Coordination normal.  Psychiatric:        Behavior: Behavior normal. Behavior is cooperative.        Thought Content: Thought content normal.        Judgment: Judgment normal.           Patient has been counseled extensively about nutrition and exercise as well as the importance of adherence with medications and regular follow-up. The patient was given clear instructions to go to ER or return to medical center if symptoms don't improve, worsen or new problems develop. The patient verbalized understanding.   Follow-up: Return in about 6 months (  around 11/16/2023).   Haze LELON Servant, FNP-BC [facility] [place], [place] [phone]   05/19/2023, 10:05 AM

## 2023-05-20 LAB — VITAMIN D 25 HYDROXY (VIT D DEFICIENCY, FRACTURES): Vit D, 25-Hydroxy: 31.7 ng/mL (ref 30.0–100.0)

## 2023-05-20 LAB — THYROID PANEL WITH TSH
Free Thyroxine Index: 2.4 (ref 1.2–4.9)
T3 Uptake Ratio: 26 % (ref 24–39)
T4, Total: 9.2 ug/dL (ref 4.5–12.0)
TSH: 4.78 u[IU]/mL — ABNORMAL HIGH (ref 0.450–4.500)

## 2023-05-22 ENCOUNTER — Other Ambulatory Visit: Payer: Self-pay

## 2023-06-04 NOTE — Progress Notes (Signed)
error

## 2023-06-04 NOTE — Progress Notes (Signed)
Error

## 2023-06-05 ENCOUNTER — Ambulatory Visit
Admission: RE | Admit: 2023-06-05 | Discharge: 2023-06-05 | Disposition: A | Discharge status: Home / Self Care | Payer: Medicaid Other | Source: Ambulatory Visit | Attending: Nurse Practitioner | Admitting: Nurse Practitioner

## 2023-06-05 ENCOUNTER — Ambulatory Visit: Payer: Medicaid Other

## 2023-06-05 DIAGNOSIS — Z1231 Encounter for screening mammogram for malignant neoplasm of breast: Secondary | ICD-10-CM

## 2023-07-29 ENCOUNTER — Other Ambulatory Visit: Payer: Self-pay

## 2023-07-29 ENCOUNTER — Other Ambulatory Visit: Payer: Self-pay | Admitting: Nurse Practitioner

## 2023-07-29 ENCOUNTER — Telehealth: Payer: Self-pay

## 2023-07-29 DIAGNOSIS — G8929 Other chronic pain: Secondary | ICD-10-CM

## 2023-07-29 NOTE — Telephone Encounter (Signed)
 Patient requesting refills on meloxicam (MOBIC) 7.5 MG tablet & Vitamin D, Ergocalciferol, (DRISDOL) 1.25 MG

## 2023-07-30 ENCOUNTER — Other Ambulatory Visit: Payer: Self-pay | Admitting: Nurse Practitioner

## 2023-07-30 ENCOUNTER — Other Ambulatory Visit: Payer: Self-pay

## 2023-07-30 MED ORDER — MELOXICAM 7.5 MG PO TABS
7.5000 mg | ORAL_TABLET | Freq: Every day | ORAL | 3 refills | Status: DC
  Filled 2023-07-30 – 2023-08-21 (×2): qty 30, 30d supply, fill #0

## 2023-07-30 NOTE — Telephone Encounter (Signed)
 She should be taking vitamin d over the counter 2000-5000 units daily. Meloxicam sent

## 2023-07-30 NOTE — Telephone Encounter (Signed)
 Can the meloxicam be refilled?

## 2023-07-31 ENCOUNTER — Other Ambulatory Visit: Payer: Self-pay

## 2023-07-31 NOTE — Telephone Encounter (Signed)
 Unable to reach patient by phone to relay response x2. Unable to leave voicemail.  Interpreter Leonardo ID# [id number].

## 2023-08-12 ENCOUNTER — Other Ambulatory Visit: Payer: Self-pay

## 2023-08-21 ENCOUNTER — Other Ambulatory Visit: Payer: Self-pay

## 2023-08-21 ENCOUNTER — Other Ambulatory Visit: Payer: Self-pay | Admitting: Nurse Practitioner

## 2023-08-21 DIAGNOSIS — E559 Vitamin D deficiency, unspecified: Secondary | ICD-10-CM

## 2023-08-25 ENCOUNTER — Other Ambulatory Visit: Payer: Self-pay

## 2023-09-04 ENCOUNTER — Other Ambulatory Visit: Payer: Self-pay | Admitting: Nurse Practitioner

## 2023-09-04 ENCOUNTER — Other Ambulatory Visit: Payer: Self-pay

## 2023-09-04 DIAGNOSIS — E039 Hypothyroidism, unspecified: Secondary | ICD-10-CM

## 2023-09-04 MED ORDER — LEVOTHYROXINE SODIUM 50 MCG PO TABS
50.0000 ug | ORAL_TABLET | Freq: Every day | ORAL | 0 refills | Status: DC
Start: 2023-09-04 — End: 2023-11-23
  Filled 2023-09-04: qty 90, 90d supply, fill #0

## 2023-09-07 ENCOUNTER — Other Ambulatory Visit: Payer: Self-pay

## 2023-11-16 ENCOUNTER — Other Ambulatory Visit: Payer: Self-pay

## 2023-11-16 ENCOUNTER — Ambulatory Visit: Payer: Medicaid Other | Attending: Nurse Practitioner | Admitting: Nurse Practitioner

## 2023-11-16 ENCOUNTER — Encounter: Payer: Self-pay | Admitting: Nurse Practitioner

## 2023-11-16 VITALS — BP 121/79 | HR 75 | Resp 19 | Ht <= 58 in | Wt 188.0 lb

## 2023-11-16 DIAGNOSIS — R7989 Other specified abnormal findings of blood chemistry: Secondary | ICD-10-CM | POA: Diagnosis not present

## 2023-11-16 DIAGNOSIS — R7303 Prediabetes: Secondary | ICD-10-CM | POA: Diagnosis not present

## 2023-11-16 DIAGNOSIS — E7800 Pure hypercholesterolemia, unspecified: Secondary | ICD-10-CM | POA: Diagnosis not present

## 2023-11-16 DIAGNOSIS — Z1211 Encounter for screening for malignant neoplasm of colon: Secondary | ICD-10-CM

## 2023-11-16 DIAGNOSIS — M25532 Pain in left wrist: Secondary | ICD-10-CM

## 2023-11-16 DIAGNOSIS — M25531 Pain in right wrist: Secondary | ICD-10-CM

## 2023-11-16 DIAGNOSIS — N814 Uterovaginal prolapse, unspecified: Secondary | ICD-10-CM

## 2023-11-16 MED ORDER — TRAMADOL HCL 50 MG PO TABS
50.0000 mg | ORAL_TABLET | Freq: Three times a day (TID) | ORAL | 0 refills | Status: AC | PRN
  Filled 2023-11-16: qty 15, 5d supply, fill #0
  Filled 2024-03-28: qty 15, 5d supply, fill #1

## 2023-11-16 NOTE — Progress Notes (Signed)
 Assessment & Plan:  Brandi [name] was seen today for medical management of chronic issues.  Diagnoses and all orders for this visit:  Bilateral wrist pain Chronic wrist pain with numbness, worsened by activity, relieved by rest. Previous x-rays and hand specialist referral were made but contact was unsuccessful. Tramadol  provided relief. - Refill tramadol  for pain management. - Provide contact information for hand specialist. -     Ambulatory referral to Hand Surgery -     traMADol  (ULTRAM ) 50 MG tablet; Take 1 tablet (50 mg total) by mouth every 8 (eight) hours as needed.  Elevated TSH -     Thyroid  Panel With TSH  Prediabetes -     Hemoglobin A1c  Hypercholesterolemia -     Lipid panel  Colon cancer screening -     Ambulatory referral to Gastroenterology  Uterine prolapse Uterine prolapse with urinary incontinence. Previous gynecology referral was made but contact was unsuccessful. - Provide contact information for gynecology. -     Ambulatory referral to Gynecology   Prediabetes Prediabetes requires monitoring.  Currently diet controlled and A1c at goal.Reports dietary changes and 8-pound weight loss. Aversion to beef not concerning. Lab Results  Component Value Date   HGBA1C 5.8 (H) 09/05/2022     Colonoscopy screening Due for colonoscopy for screening. - Provide contact information for gastroenterology.  Memory issues Reports longstanding memory issues, needs assistance with appointment scheduling. - Provide written information with contact numbers and purposes for appointments.  Patient has been counseled on age-appropriate routine health concerns for screening and prevention. These are reviewed and up-to-date. Referrals have been placed accordingly. Immunizations are up-to-date or declined.    Subjective:   Chief Complaint  Patient presents with   Medical Management of Chronic Issues    Brandi [name] 64 y.o. female presents to office today bilateral wrist  pain  VRI was used to communicate directly with patient for the entire encounter including providing detailed patient instructions.    She has a past medical history of Elevated BP without diagnosis of hypertension, GERD, and Obesity.   Bilateral wrist pain and hand numbness Ongoing for several months.  Persistent wrist pain, severity 9/10 - Pain worsens with activities such as sweeping and mopping - Pain subsides with rest - Numbness in both hands, with one finger experiencing complete loss of sensation - Sensation of hand shaking with pain when pressing a specific area - Tramadol  prescribed in January provided effective pain relief She was referred to the hand specialist several months ago however she was unable to follow up due to lack of phone access; now has new phone number and voicemail  Urinary incontinence and urgency - Urinary leakage with sneezing or lifting weights - Urinary urgency.  At her last Pap with me a few years ago she was referred to gynecology for prolapsed uterus.  She was lost to follow-up.  I have instructed her today that the gynecology office attempted to reach her several times and says they were unable to they closed her referral.  I will place another referral today and she has been given the office number to gynecology.   Dietary changes and weight loss - Eliminated flour and regular sugar from diet, using diet sugar instead - Eight-pound weight loss She notes decreased appetite - Aversion to beef and pork; tolerates only [place] and fish  Hypothyroidism Currently taking levothyroxine  50 mg daily as prescribed.  She does endorse longstanding memory problems including short-term memory loss. Lab Results  Component Value Date  TSH 4.780 (H) 05/19/2023       Review of Systems  Constitutional:  Negative for fever, malaise/fatigue and weight loss.  HENT: Negative.  Negative for nosebleeds.   Eyes: Negative.  Negative for blurred vision, double vision  and photophobia.  Respiratory: Negative.  Negative for cough and shortness of breath.   Cardiovascular: Negative.  Negative for chest pain, palpitations and leg swelling.  Gastrointestinal: Negative.  Negative for heartburn, nausea and vomiting.  Genitourinary:        See HPI  Musculoskeletal:  Positive for joint pain. Negative for myalgias.  Neurological:  Positive for tingling and sensory change. Negative for dizziness, focal weakness, seizures and headaches.  Psychiatric/Behavioral:  Positive for memory loss. Negative for suicidal ideas.     Past Medical History:  Diagnosis Date   Elevated BP without diagnosis of hypertension    GERD (gastroesophageal reflux disease)    Obesity     Past Surgical History:  Procedure Laterality Date   Arthroscopic Knee Surgery Right 2018   Repair of ligament or tendon damage from work related injury   CESAREAN SECTION  1990   TUBAL LIGATION  1993    Family History  Problem Relation Age of Onset   Stroke Mother        cause of death   Hypertension Mother    Cancer Maternal Aunt        Breast   Pneumonia Father    Hyperlipidemia Sister    Depression Sister    Depression Sister    Colitis Sister    Pneumonia Brother     Social History Reviewed with no changes to be made today.   Outpatient Medications Prior to Visit  Medication Sig Dispense Refill   albuterol  (VENTOLIN  HFA) 108 (90 Base) MCG/ACT inhaler Inhale 1-2 puffs into the lungs every 6 (six) hours as needed for wheezing or shortness of breath. 18 g 0   levothyroxine  (SYNTHROID ) 50 MCG tablet Take 1 tablet (50 mcg total) by mouth daily. FOR THYROID  90 tablet 0   meloxicam  (MOBIC ) 7.5 MG tablet Take 1 tablet (7.5 mg total) by mouth daily for shoulder pain. 30 tablet 3   triamcinolone  (NASACORT ) 55 MCG/ACT AERO nasal inhaler Place 2 sprays into the nose daily. 1 each 12   benzonatate  (TESSALON ) 100 MG capsule Take 1 capsule (100 mg total) by mouth 3 (three) times daily as needed. 21  capsule 0   fexofenadine (ALLEGRA) 60 MG tablet Take 60 mg by mouth 2 (two) times daily.     traMADol  (ULTRAM ) 50 MG tablet Take 1 tablet (50 mg total) by mouth every 8 (eight) hours as needed. 30 tablet 0   Vitamin D , Ergocalciferol , (DRISDOL ) 1.25 MG (50000 UNIT) CAPS capsule Take 1 capsule (50,000 Units total) by mouth every 7 (seven) days. For low vitamin D  12 capsule 0   No facility-administered medications prior to visit.    No Known Allergies     Objective:    BP 121/79 (BP Location: Left Arm, Patient Position: Sitting, Cuff Size: Normal)   Pulse 75   Resp 19   Ht 4' 8 (1.422 m)   Wt 188 lb (85.3 kg)   LMP  (LMP Unknown)   SpO2 100%   BMI 42.15 kg/m  Wt Readings from Last 3 Encounters:  11/16/23 188 lb (85.3 kg)  05/19/23 196 lb 3.2 oz (89 kg)  05/13/23 190 lb (86.2 kg)    Physical Exam Vitals and nursing note reviewed.  Constitutional:  Appearance: She is well-developed.  HENT:     Head: Normocephalic and atraumatic.  Cardiovascular:     Rate and Rhythm: Normal rate and regular rhythm.     Heart sounds: Normal heart sounds. No murmur heard.    No friction rub. No gallop.  Pulmonary:     Effort: Pulmonary effort is normal. No tachypnea or respiratory distress.     Breath sounds: Normal breath sounds. No decreased breath sounds, wheezing, rhonchi or rales.  Chest:     Chest wall: No tenderness.  Abdominal:     General: Bowel sounds are normal.     Palpations: Abdomen is soft.  Musculoskeletal:        General: Normal range of motion.     Right hand: Swelling and tenderness present.     Left hand: Swelling and tenderness present.     Cervical back: Normal range of motion.  Skin:    General: Skin is warm and dry.  Neurological:     Mental Status: She is alert and oriented to person, place, and time.     Coordination: Coordination normal.  Psychiatric:        Behavior: Behavior normal. Behavior is cooperative.        Thought Content: Thought content  normal.        Judgment: Judgment normal.          Patient has been counseled extensively about nutrition and exercise as well as the importance of adherence with medications and regular follow-up. The patient was given clear instructions to go to ER or return to medical center if symptoms don't improve, worsen or new problems develop. The patient verbalized understanding.   Follow-up: Return in about 3 months (around 02/16/2024).    Discussed the use of AI scribe software for clinical note transcription with the patient, who gave verbal consent to proceed.     Haze LELON Servant, FNP-BC [facility] [place], [place] [phone]   11/16/2023, 2:08 PM

## 2023-11-16 NOTE — Patient Instructions (Addendum)
 [facility] for hand pain Ph# [phone] [address] [place], [place] [address]   GYNECOLOGY for uterine problems [facility] Located in: [facility] Address: [address], [place], [place] [address] Phone: [phone]   [facility] for colonoscopy Located in: [facility] [address] Address: [address], [place], [place] [address] Phone: [phone]

## 2023-11-17 LAB — THYROID PANEL WITH TSH
Free Thyroxine Index: 2.4 (ref 1.2–4.9)
T3 Uptake Ratio: 25 % (ref 24–39)
T4, Total: 9.6 ug/dL (ref 4.5–12.0)
TSH: 3.26 u[IU]/mL (ref 0.450–4.500)

## 2023-11-17 LAB — LIPID PANEL
Chol/HDL Ratio: 3.9 ratio (ref 0.0–4.4)
Cholesterol, Total: 186 mg/dL (ref 100–199)
HDL: 48 mg/dL (ref >39)
LDL Chol Calc (NIH): 111 mg/dL — ABNORMAL HIGH (ref 0–99)
Triglycerides: 152 mg/dL — ABNORMAL HIGH (ref 0–149)
VLDL Cholesterol Cal: 27 mg/dL (ref 5–40)

## 2023-11-17 LAB — HEMOGLOBIN A1C
Est. average glucose Bld gHb Est-mCnc: 114 mg/dL
Hgb A1c MFr Bld: 5.6 % (ref 4.8–5.6)

## 2023-11-18 ENCOUNTER — Other Ambulatory Visit: Payer: Self-pay

## 2023-11-23 ENCOUNTER — Other Ambulatory Visit: Payer: Self-pay

## 2023-11-23 ENCOUNTER — Ambulatory Visit: Payer: Self-pay | Admitting: Nurse Practitioner

## 2023-11-23 DIAGNOSIS — E039 Hypothyroidism, unspecified: Secondary | ICD-10-CM

## 2023-11-23 MED ORDER — LEVOTHYROXINE SODIUM 50 MCG PO TABS
50.0000 ug | ORAL_TABLET | Freq: Every day | ORAL | 1 refills | Status: AC
  Filled 2023-11-23: qty 90, 90d supply, fill #0
  Filled 2024-03-28: qty 90, 90d supply, fill #1

## 2023-11-24 ENCOUNTER — Other Ambulatory Visit: Payer: Self-pay

## 2023-12-08 ENCOUNTER — Other Ambulatory Visit: Payer: Self-pay

## 2023-12-08 ENCOUNTER — Ambulatory Visit (INDEPENDENT_AMBULATORY_CARE_PROVIDER_SITE_OTHER): Admitting: Orthopaedic Surgery

## 2023-12-08 DIAGNOSIS — G5601 Carpal tunnel syndrome, right upper limb: Secondary | ICD-10-CM | POA: Insufficient documentation

## 2023-12-08 DIAGNOSIS — G5602 Carpal tunnel syndrome, left upper limb: Secondary | ICD-10-CM | POA: Insufficient documentation

## 2023-12-08 DIAGNOSIS — G5603 Carpal tunnel syndrome, bilateral upper limbs: Secondary | ICD-10-CM | POA: Diagnosis not present

## 2023-12-08 NOTE — Progress Notes (Signed)
 Office Visit Note   Patient: Brandi [name]           Date of Birth: February 01, 1961           MRN: [id number] Visit Date: 12/08/2023              Requested by: Theotis Haze ORN, NP [id number] [place] [address] [place],  [place] [address] PCP: Theotis Haze ORN, NP   Assessment & Plan: Visit Diagnoses:  1. Right carpal tunnel syndrome   2. Left carpal tunnel syndrome     Plan: History of Present Illness Brandi [name] is a 63 year old female who presents with hand and wrist weakness and numbness.  For the past three months, she experiences morning hand weakness, describing a sensation of her hands 'freezing' and an inability to open them. Numbness and tingling are present in her fingers, with one finger feeling completely numb. She also describes a lump near the elbow, felt as a 'bump', causing discomfort in the area. There is a history of hand pain and decreased sensitivity in one finger.  Physical Exam MUSCULOSKELETAL: Thenar muscle atrophy on right hand.  Positive carpal tunnel compressive signs.    Assessment and Plan Bilateral carpal tunnel syndrome Symptoms and muscle flattening indicate severe carpal tunnel syndrome of right hand. - Order nerve conduction study to confirm diagnosis. - Discuss treatment options post-confirmation.  Follow-Up Instructions: No follow-ups on file.   Orders:  No orders of the defined types were placed in this encounter.  No orders of the defined types were placed in this encounter.     Procedures: No procedures performed   Clinical Data: No additional findings.   Subjective: Chief Complaint  Patient presents with   Left Hand - Pain   Right Hand - Pain    HPI  Review of Systems  Constitutional: Negative.   HENT: Negative.    Eyes: Negative.   Respiratory: Negative.    Cardiovascular: Negative.   Endocrine: Negative.   Musculoskeletal: Negative.   Neurological: Negative.   Hematological: Negative.    Psychiatric/Behavioral: Negative.    All other systems reviewed and are negative.    Objective: Vital Signs: LMP  (LMP Unknown)   Physical Exam Vitals and nursing note reviewed.  Constitutional:      Appearance: She is well-developed.  HENT:     Head: Atraumatic.     Nose: Nose normal.  Eyes:     Extraocular Movements: Extraocular movements intact.  Cardiovascular:     Pulses: Normal pulses.  Pulmonary:     Effort: Pulmonary effort is normal.  Abdominal:     Palpations: Abdomen is soft.  Musculoskeletal:     Cervical back: Neck supple.  Skin:    General: Skin is warm.     Capillary Refill: Capillary refill takes less than 2 seconds.  Neurological:     Mental Status: She is alert. Mental status is at baseline.  Psychiatric:        Behavior: Behavior normal.        Thought Content: Thought content normal.        Judgment: Judgment normal.     Ortho Exam  Specialty Comments:  No specialty comments available.  Imaging: No results found.   PMFS History: Patient Active Problem List   Diagnosis Date Noted   Right carpal tunnel syndrome 12/08/2023   Left carpal tunnel syndrome 12/08/2023   Dental decay 07/21/2018   Sialoadenitis of submandibular gland 07/21/2018   External hemorrhoid 05/13/2018  Elevated BP without diagnosis of hypertension    Fatigue 11/19/2017   Morbid obesity (HCC) 06/10/2017   Essential hypertension 06/10/2017   Osteoarthritis of right knee 05/26/2017   Chalazion of right eye 12/07/2013   Left shoulder pain 12/07/2013   Environmental allergies 12/07/2013   Past Medical History:  Diagnosis Date   Elevated BP without diagnosis of hypertension    GERD (gastroesophageal reflux disease)    Obesity     Family History  Problem Relation Age of Onset   Stroke Mother        cause of death   Hypertension Mother    Cancer Maternal Aunt        Breast   Pneumonia Father    Hyperlipidemia Sister    Depression Sister    Depression Sister     Colitis Sister    Pneumonia Brother     Past Surgical History:  Procedure Laterality Date   Arthroscopic Knee Surgery Right 2018   Repair of ligament or tendon damage from work related injury   CESAREAN SECTION  1990   TUBAL LIGATION  1993   Social History   Occupational History   Occupation: Housewife  Tobacco Use   Smoking status: Never   Smokeless tobacco: Never  Vaping Use   Vaping status: Never Used  Substance and Sexual Activity   Alcohol use: No   Drug use: No   Sexual activity: Not Currently

## 2023-12-10 ENCOUNTER — Encounter (HOSPITAL_BASED_OUTPATIENT_CLINIC_OR_DEPARTMENT_OTHER): Admitting: Obstetrics & Gynecology

## 2023-12-30 ENCOUNTER — Ambulatory Visit (INDEPENDENT_AMBULATORY_CARE_PROVIDER_SITE_OTHER): Admitting: Physical Medicine and Rehabilitation

## 2023-12-30 DIAGNOSIS — M79642 Pain in left hand: Secondary | ICD-10-CM

## 2023-12-30 DIAGNOSIS — R202 Paresthesia of skin: Secondary | ICD-10-CM

## 2023-12-30 DIAGNOSIS — R29898 Other symptoms and signs involving the musculoskeletal system: Secondary | ICD-10-CM

## 2023-12-30 DIAGNOSIS — M79641 Pain in right hand: Secondary | ICD-10-CM

## 2023-12-30 NOTE — Progress Notes (Signed)
 Brandi [name] - 63 y.o. female MRN [id number]  Date of birth: [birth year]  Office Visit Note: Visit Date: 12/30/2023 PCP: Theotis Haze ORN, NP Referred by: Jerri Kay HERO, MD  Subjective: Chief Complaint  Patient presents with   Right Hand - Pain, Numbness, Weakness   Left Hand - Pain, Numbness, Weakness   HPI: Brandi [name] is a 63 y.o. female who comes in today at the request of Dr. Ozell Jerri for evaluation and management of chronic, worsening and severe pain, numbness and tingling in the Bilateral upper extremities.  Patient is Right hand dominant.  She reports really years of off-and-on symptoms but 3 to 4 months of severe worsening hand weakness where she really feels like her hands are frozen and she cannot really open or close them.  She gets a lot of numbness and tingling in all of the digits globally.  She feels like the finger at times can be completely numb.  She does not really note right more than the left.  She does endorse weakness.  She does not endorse any frank radicular symptoms down the arms.  She has no history of diabetes or thyroid  disease.  No prior electrodiagnostic studies.  She has had bracing and medication management without any relief.   I spent more than 30 minutes speaking face-to-face with the patient with 50% of the time in counseling and discussing coordination of care.      Review of Systems  Musculoskeletal:  Positive for joint pain.  Neurological:  Positive for tingling and focal weakness.  All other systems reviewed and are negative.  Otherwise per HPI.  Assessment & Plan: Visit Diagnoses:    ICD-10-CM   1. Paresthesia of skin  R20.2 NCV with EMG (electromyography)    2. Bilateral hand pain  M79.641    M79.642     3. Weakness of both hands  R29.898        Plan: Impression: Clinically likely has fairly significant carpal tunnel syndrome on top of hand arthritis.  Electrodiagnostic study performed today.  The above electrodiagnostic  study is ABNORMAL and reveals evidence of a severe bilateral median nerve entrapment at the wrist (carpal tunnel syndrome) affecting sensory and motor components.   The lesion is characterized by sensory and motor demyelination with evidence of significant axonal injury.   Recommendations: 1.  Follow-up with referring physician. 2.  Continue current management of symptoms. 3.  Suggest surgical evaluation.  Meds & Orders: No orders of the defined types were placed in this encounter.   Orders Placed This Encounter  Procedures   NCV with EMG (electromyography)    Follow-up: Return for  Ozell Jerri, MD.   Procedures: No procedures performed  EMG & NCV Findings: Evaluation of the left median motor and the right median motor nerves showed prolonged distal onset latency (L11.3, R6.2 ms), reduced amplitude (L3.1, R0.0 mV), and decreased conduction velocity (Elbow-Wrist, L42, R24 m/s).  The left median (across palm) sensory nerve showed prolonged distal peak latency (Wrist, 5.6 ms), reduced amplitude (1.5 V), and prolonged distal peak latency (Palm, 4.8 ms).  The right median (across palm) sensory nerve showed no response (Wrist) and no response (Palm).  All remaining nerves (as indicated in the following tables) were within normal limits.  Left vs. Right side comparison data for the median motor nerve indicates abnormal L-R latency difference (5.1 ms), abnormal L-R amplitude difference (100.0 %), and abnormal L-R velocity difference (Elbow-Wrist, 18 m/s).  The ulnar motor nerve indicates abnormal L-R  velocity difference (B Elbow-Wrist, 15 m/s).  All remaining left vs. right side differences were within normal limits.    Needle evaluation of the right abductor pollicis brevis muscle showed widespread spontaneous activity.  All remaining muscles (as indicated in the following table) showed no evidence of electrical instability.    Impression: The above electrodiagnostic study is ABNORMAL and reveals  evidence of a severe bilateral median nerve entrapment at the wrist (carpal tunnel syndrome) affecting sensory and motor components.   The lesion is characterized by sensory and motor demyelination with evidence of significant axonal injury.   Recommendations: 1.  Follow-up with referring physician. 2.  Continue current management of symptoms. 3.  Suggest surgical evaluation.  ___________________________ Prentice Masters FAAPMR Board Certified, American Board of Physical Medicine and Rehabilitation    Nerve Conduction Studies Anti Sensory Summary Table   Stim Site NR Peak (ms) Norm Peak (ms) P-T Amp (V) Norm P-T Amp Site1 Site2 Delta-P (ms) Dist (cm) Vel (m/s) Norm Vel (m/s)  Left Median Acr Palm Anti Sensory (2nd Digit)  31.2C  Wrist    *5.6 <3.6 *1.5 >10 Wrist Palm 0.8 0.0    Palm    *4.8 <2.0 10.5             17.3  8.4         Right Median Acr Palm Anti Sensory (2nd Digit)  31C  Wrist *NR  <3.6  >10 Wrist Palm  0.0    Palm *NR  <2.0          Left Radial Anti Sensory (Base 1st Digit)  31.3C  Wrist    1.9 <3.1 15.4  Wrist Base 1st Digit 1.9 0.0    Right Radial Anti Sensory (Base 1st Digit)  30.8C  Wrist    2.0 <3.1 31.3  Wrist Base 1st Digit 2.0 0.0    Left Ulnar Anti Sensory (5th Digit)  31.5C  Wrist    3.1 <3.7 26.5 >15.0 Wrist 5th Digit 3.1 14.0 45 >38  Right Ulnar Anti Sensory (5th Digit)  31.4C  Wrist    2.9 <3.7 23.8 >15.0 Wrist 5th Digit 2.9 14.0 48 >38   Motor Summary Table   Stim Site NR Onset (ms) Norm Onset (ms) O-P Amp (mV) Norm O-P Amp Site1 Site2 Delta-0 (ms) Dist (cm) Vel (m/s) Norm Vel (m/s)  Left Median Motor (Abd Poll Brev)  31.3C  Wrist    *11.3 <4.2 *3.1 >5 Elbow Wrist 4.6 19.5 *42 >50  Elbow    15.9  3.0         Right Median Motor (Abd Poll Brev)  31C  Wrist    *6.2 <4.2 *0.0 >5 Elbow Wrist 7.6 18.0 *24 >50  Elbow    13.8  0.0         Left Ulnar Motor (Abd Dig Min)  31.4C  Wrist    3.0 <4.2 7.3 >3 B Elbow Wrist 2.6 20.0 77 >53  B Elbow    5.6   8.8  A Elbow B Elbow 1.0 10.0 100 >53  A Elbow    6.6  8.6         Right Ulnar Motor (Abd Dig Min)  31.2C  Wrist    2.9 <4.2 9.4 >3 B Elbow Wrist 2.9 18.0 62 >53  B Elbow    5.8  9.7  A Elbow B Elbow 0.9 10.0 111 >53  A Elbow    6.7  9.7          EMG  Side Muscle Nerve Root Ins Act Fibs Psw Amp Dur Poly Recrt Int Bruna Comment  Right Abd Poll Brev Median C8-T1 Nml *4+ *4+ Nml Nml 0 Nml Nml rare MUAP  Right 1stDorInt Ulnar C8-T1 Nml Nml Nml Nml Nml 0 Nml Nml   Right PronatorTeres Median C6-7 Nml Nml Nml Nml Nml 0 Nml Nml   Right Biceps Musculocut C5-6 Nml Nml Nml Nml Nml 0 Nml Nml     Nerve Conduction Studies Anti Sensory Left/Right Comparison   Stim Site L Lat (ms) R Lat (ms) L-R Lat (ms) L Amp (V) R Amp (V) L-R Amp (%) Site1 Site2 L Vel (m/s) R Vel (m/s) L-R Vel (m/s)  Median Acr Palm Anti Sensory (2nd Digit)  31.2C  Wrist *5.6   *1.5   Wrist Palm     Palm *4.8   10.5          17.3   8.4         Radial Anti Sensory (Base 1st Digit)  31.3C  Wrist 1.9 2.0 0.1 15.4 31.3 50.8 Wrist Base 1st Digit     Ulnar Anti Sensory (5th Digit)  31.5C  Wrist 3.1 2.9 0.2 26.5 23.8 10.2 Wrist 5th Digit 45 48 3   Motor Left/Right Comparison   Stim Site L Lat (ms) R Lat (ms) L-R Lat (ms) L Amp (mV) R Amp (mV) L-R Amp (%) Site1 Site2 L Vel (m/s) R Vel (m/s) L-R Vel (m/s)  Median Motor (Abd Poll Brev)  31.3C  Wrist *11.3 *6.2 *5.1 *3.1 *0.0 *100.0 Elbow Wrist *42 *24 *18  Elbow 15.9 13.8 2.1 3.0 0.0 100.0       Ulnar Motor (Abd Dig Min)  31.4C  Wrist 3.0 2.9 0.1 7.3 9.4 22.3 B Elbow Wrist 77 62 *15  B Elbow 5.6 5.8 0.2 8.8 9.7 9.3 A Elbow B Elbow 100 111 11  A Elbow 6.6 6.7 0.1 8.6 9.7 11.3          Waveforms:                     Clinical History: No specialty comments available.   She reports that she has never smoked. She has never used smokeless tobacco.  Recent Labs    11/16/23 1048  HGBA1C 5.6    Objective:  VS:  HT:    WT:   BMI:     BP:   HR: bpm  TEMP: ( )   RESP:  Physical Exam Vitals and nursing note reviewed.  Constitutional:      General: She is not in acute distress.    Appearance: Normal appearance. She is well-developed. She is obese. She is not ill-appearing.  HENT:     Head: Normocephalic and atraumatic.  Eyes:     Conjunctiva/sclera: Conjunctivae normal.     Pupils: Pupils are equal, round, and reactive to light.  Cardiovascular:     Rate and Rhythm: Normal rate.     Pulses: Normal pulses.  Pulmonary:     Effort: Pulmonary effort is normal.  Musculoskeletal:        General: Tenderness present. No swelling or deformity.     Right lower leg: No edema.     Left lower leg: No edema.     Comments: Inspection reveals no atrophy of the bilateral APB or FDI or hand intrinsics. There is no swelling, color changes, allodynia or dystrophic changes. There is 5 out of 5 strength in the bilateral wrist extension, finger abduction and  long finger flexion.  There is decreased sensation to light touch in the right median nerve distribution. There is a negative Froment's test bilaterally.  There is a positive Phalen's test bilaterally. There is a negative Hoffmann's test bilaterally.  Skin:    General: Skin is warm and dry.     Findings: No erythema or rash.  Neurological:     General: No focal deficit present.     Mental Status: She is alert and oriented to person, place, and time.     Cranial Nerves: No cranial nerve deficit.     Sensory: Sensory deficit present.     Motor: No weakness or abnormal muscle tone.     Coordination: Coordination normal.     Gait: Gait normal.  Psychiatric:        Mood and Affect: Mood normal.        Behavior: Behavior normal.     Ortho Exam  Imaging: No results found.  Past Medical/Family/Surgical/Social History: Medications & Allergies reviewed per EMR, new medications updated. Patient Active Problem List   Diagnosis Date Noted   Right carpal tunnel syndrome 12/08/2023   Left carpal tunnel syndrome  12/08/2023   Dental decay 07/21/2018   Sialoadenitis of submandibular gland 07/21/2018   External hemorrhoid 05/13/2018   Elevated BP without diagnosis of hypertension    Fatigue 11/19/2017   Morbid obesity (HCC) 06/10/2017   Essential hypertension 06/10/2017   Osteoarthritis of right knee 05/26/2017   Chalazion of right eye 12/07/2013   Left shoulder pain 12/07/2013   Environmental allergies 12/07/2013   Past Medical History:  Diagnosis Date   Elevated BP without diagnosis of hypertension    GERD (gastroesophageal reflux disease)    Obesity    Family History  Problem Relation Age of Onset   Stroke Mother        cause of death   Hypertension Mother    Cancer Maternal Aunt        Breast   Pneumonia Father    Hyperlipidemia Sister    Depression Sister    Depression Sister    Colitis Sister    Pneumonia Brother    Past Surgical History:  Procedure Laterality Date   Arthroscopic Knee Surgery Right 2018   Repair of ligament or tendon damage from work related injury   CESAREAN SECTION  1990   TUBAL LIGATION  1993   Social History   Occupational History   Occupation: Housewife  Tobacco Use   Smoking status: Never   Smokeless tobacco: Never  Vaping Use   Vaping status: Never Used  Substance and Sexual Activity   Alcohol use: No   Drug use: No   Sexual activity: Not Currently

## 2023-12-30 NOTE — Progress Notes (Signed)
 Pain Scale   Average Pain 8 Patient advising she had pain and numbness, tingling and weakness in both hands and pain increases at night. Patient is Right hand dominate         +Driver, -BT, -Dye Allergies.

## 2024-01-04 ENCOUNTER — Encounter: Payer: Self-pay | Admitting: Physical Medicine and Rehabilitation

## 2024-01-04 NOTE — Procedures (Signed)
 EMG & NCV Findings: Evaluation of the left median motor and the right median motor nerves showed prolonged distal onset latency (L11.3, R6.2 ms), reduced amplitude (L3.1, R0.0 mV), and decreased conduction velocity (Elbow-Wrist, L42, R24 m/s).  The left median (across palm) sensory nerve showed prolonged distal peak latency (Wrist, 5.6 ms), reduced amplitude (1.5 V), and prolonged distal peak latency (Palm, 4.8 ms).  The right median (across palm) sensory nerve showed no response (Wrist) and no response (Palm).  All remaining nerves (as indicated in the following tables) were within normal limits.  Left vs. Right side comparison data for the median motor nerve indicates abnormal L-R latency difference (5.1 ms), abnormal L-R amplitude difference (100.0 %), and abnormal L-R velocity difference (Elbow-Wrist, 18 m/s).  The ulnar motor nerve indicates abnormal L-R velocity difference (B Elbow-Wrist, 15 m/s).  All remaining left vs. right side differences were within normal limits.    Needle evaluation of the right abductor pollicis brevis muscle showed widespread spontaneous activity.  All remaining muscles (as indicated in the following table) showed no evidence of electrical instability.    Impression: The above electrodiagnostic study is ABNORMAL and reveals evidence of a severe bilateral median nerve entrapment at the wrist (carpal tunnel syndrome) affecting sensory and motor components.   The lesion is characterized by sensory and motor demyelination with evidence of significant axonal injury.   Recommendations: 1.  Follow-up with referring physician. 2.  Continue current management of symptoms. 3.  Suggest surgical evaluation.  ___________________________ Prentice Masters FAAPMR Board Certified, American Board of Physical Medicine and Rehabilitation    Nerve Conduction Studies Anti Sensory Summary Table   Stim Site NR Peak (ms) Norm Peak (ms) P-T Amp (V) Norm P-T Amp Site1 Site2 Delta-P (ms)  Dist (cm) Vel (m/s) Norm Vel (m/s)  Left Median Acr Palm Anti Sensory (2nd Digit)  31.2C  Wrist    *5.6 <3.6 *1.5 >10 Wrist Palm 0.8 0.0    Palm    *4.8 <2.0 10.5             17.3  8.4         Right Median Acr Palm Anti Sensory (2nd Digit)  31C  Wrist *NR  <3.6  >10 Wrist Palm  0.0    Palm *NR  <2.0          Left Radial Anti Sensory (Base 1st Digit)  31.3C  Wrist    1.9 <3.1 15.4  Wrist Base 1st Digit 1.9 0.0    Right Radial Anti Sensory (Base 1st Digit)  30.8C  Wrist    2.0 <3.1 31.3  Wrist Base 1st Digit 2.0 0.0    Left Ulnar Anti Sensory (5th Digit)  31.5C  Wrist    3.1 <3.7 26.5 >15.0 Wrist 5th Digit 3.1 14.0 45 >38  Right Ulnar Anti Sensory (5th Digit)  31.4C  Wrist    2.9 <3.7 23.8 >15.0 Wrist 5th Digit 2.9 14.0 48 >38   Motor Summary Table   Stim Site NR Onset (ms) Norm Onset (ms) O-P Amp (mV) Norm O-P Amp Site1 Site2 Delta-0 (ms) Dist (cm) Vel (m/s) Norm Vel (m/s)  Left Median Motor (Abd Poll Brev)  31.3C  Wrist    *11.3 <4.2 *3.1 >5 Elbow Wrist 4.6 19.5 *42 >50  Elbow    15.9  3.0         Right Median Motor (Abd Poll Brev)  31C  Wrist    *6.2 <4.2 *0.0 >5 Elbow Wrist 7.6 18.0 *24 >50  Elbow    13.8  0.0         Left Ulnar Motor (Abd Dig Min)  31.4C  Wrist    3.0 <4.2 7.3 >3 B Elbow Wrist 2.6 20.0 77 >53  B Elbow    5.6  8.8  A Elbow B Elbow 1.0 10.0 100 >53  A Elbow    6.6  8.6         Right Ulnar Motor (Abd Dig Min)  31.2C  Wrist    2.9 <4.2 9.4 >3 B Elbow Wrist 2.9 18.0 62 >53  B Elbow    5.8  9.7  A Elbow B Elbow 0.9 10.0 111 >53  A Elbow    6.7  9.7          EMG   Side Muscle Nerve Root Ins Act Fibs Psw Amp Dur Poly Recrt Int Bruna Comment  Right Abd Poll Brev Median C8-T1 Nml *4+ *4+ Nml Nml 0 Nml Nml rare MUAP  Right 1stDorInt Ulnar C8-T1 Nml Nml Nml Nml Nml 0 Nml Nml   Right PronatorTeres Median C6-7 Nml Nml Nml Nml Nml 0 Nml Nml   Right Biceps Musculocut C5-6 Nml Nml Nml Nml Nml 0 Nml Nml     Nerve Conduction Studies Anti Sensory Left/Right  Comparison   Stim Site L Lat (ms) R Lat (ms) L-R Lat (ms) L Amp (V) R Amp (V) L-R Amp (%) Site1 Site2 L Vel (m/s) R Vel (m/s) L-R Vel (m/s)  Median Acr Palm Anti Sensory (2nd Digit)  31.2C  Wrist *5.6   *1.5   Wrist Palm     Palm *4.8   10.5          17.3   8.4         Radial Anti Sensory (Base 1st Digit)  31.3C  Wrist 1.9 2.0 0.1 15.4 31.3 50.8 Wrist Base 1st Digit     Ulnar Anti Sensory (5th Digit)  31.5C  Wrist 3.1 2.9 0.2 26.5 23.8 10.2 Wrist 5th Digit 45 48 3   Motor Left/Right Comparison   Stim Site L Lat (ms) R Lat (ms) L-R Lat (ms) L Amp (mV) R Amp (mV) L-R Amp (%) Site1 Site2 L Vel (m/s) R Vel (m/s) L-R Vel (m/s)  Median Motor (Abd Poll Brev)  31.3C  Wrist *11.3 *6.2 *5.1 *3.1 *0.0 *100.0 Elbow Wrist *42 *24 *18  Elbow 15.9 13.8 2.1 3.0 0.0 100.0       Ulnar Motor (Abd Dig Min)  31.4C  Wrist 3.0 2.9 0.1 7.3 9.4 22.3 B Elbow Wrist 77 62 *15  B Elbow 5.6 5.8 0.2 8.8 9.7 9.3 A Elbow B Elbow 100 111 11  A Elbow 6.6 6.7 0.1 8.6 9.7 11.3          Waveforms:

## 2024-01-05 ENCOUNTER — Encounter: Payer: Self-pay | Admitting: Nurse Practitioner

## 2024-01-12 ENCOUNTER — Ambulatory Visit: Admitting: Orthopaedic Surgery

## 2024-01-12 DIAGNOSIS — G5603 Carpal tunnel syndrome, bilateral upper limbs: Secondary | ICD-10-CM

## 2024-01-12 DIAGNOSIS — G5602 Carpal tunnel syndrome, left upper limb: Secondary | ICD-10-CM | POA: Diagnosis not present

## 2024-01-12 DIAGNOSIS — G5601 Carpal tunnel syndrome, right upper limb: Secondary | ICD-10-CM | POA: Diagnosis not present

## 2024-01-12 NOTE — Progress Notes (Signed)
 Office Visit Note   Patient: Brandi [name]           Date of Birth: 1960-11-14           MRN: [id number] Visit Date: 01/12/2024              Requested by: Theotis Haze ORN, NP [address] [place] [address] [place],  [place] [address] PCP: Theotis Haze ORN, NP   Assessment & Plan: Visit Diagnoses:  1. Right carpal tunnel syndrome   2. Left carpal tunnel syndrome     Plan: History of Present Illness Brandi [name] is a 63 year old female who presents for a review of EMG results and surgical consultation for severe carpal tunnel syndrome.  She experiences symptoms in both hands, with more severe pain and discomfort on the right. There is significant pain in her right arm, which she attributes to an issue in her upper arm. She describes a 'busto de carne' in her hand contributing to the pain.  Her symptoms interfere with her work, causing numbness and stiffness in her hands, sometimes to the point where she cannot open them. Prior EMG testing confirmed severe carpal tunnel syndrome.  She expresses a fear of operations due to past experiences with C-sections.  Physical Exam MUSCULOSKELETAL: Thenar atrophy in right hand.  Results DIAGNOSTIC Electromyography (EMG): Severe carpal tunnel syndrome in both hands, more pronounced on the right side.  Assessment and Plan Bilateral carpal tunnel syndrome Severe bilateral carpal tunnel syndrome confirmed by EMG, more symptomatic on the right. Muscle atrophy noted, especially on the right, may not fully recover post-surgery. - Schedule right carpal tunnel release surgery to relieve nerve pressure and prevent further hand function deterioration. - Plan left carpal tunnel release surgery 3-4 weeks post right hand surgery. - Ensure surgery scheduler contacts her in Spanish or leaves a voicemail. - Explained surgery is minor, low risk, with minimal anesthesia. Improvement gradual. Emphasized surgery importance to prevent complete hand  function loss. Addressed surgery risk concerns, comparing to previous C-sections, reassured greater risk without surgery.  Language barrier increased complexity of the visit.  Follow-Up Instructions: No follow-ups on file.   Orders:  No orders of the defined types were placed in this encounter.  No orders of the defined types were placed in this encounter.     Procedures: No procedures performed   Clinical Data: No additional findings.   Subjective: Chief Complaint  Patient presents with   Right Wrist - Follow-up    EMG review   Left Wrist - Follow-up    HPI  Review of Systems  Constitutional: Negative.   HENT: Negative.    Eyes: Negative.   Respiratory: Negative.    Cardiovascular: Negative.   Endocrine: Negative.   Musculoskeletal: Negative.   Neurological: Negative.   Hematological: Negative.   Psychiatric/Behavioral: Negative.    All other systems reviewed and are negative.    Objective: Vital Signs: LMP  (LMP Unknown)   Physical Exam Vitals and nursing note reviewed.  Constitutional:      Appearance: She is well-developed.  HENT:     Head: Atraumatic.     Nose: Nose normal.  Eyes:     Extraocular Movements: Extraocular movements intact.  Cardiovascular:     Pulses: Normal pulses.  Pulmonary:     Effort: Pulmonary effort is normal.  Abdominal:     Palpations: Abdomen is soft.  Musculoskeletal:     Cervical back: Neck supple.  Skin:    General: Skin is warm.  Capillary Refill: Capillary refill takes less than 2 seconds.  Neurological:     Mental Status: She is alert. Mental status is at baseline.  Psychiatric:        Behavior: Behavior normal.        Thought Content: Thought content normal.        Judgment: Judgment normal.     Ortho Exam  Specialty Comments:  No specialty comments available.  Imaging: No results found.   PMFS History: Patient Active Problem List   Diagnosis Date Noted   Right carpal tunnel syndrome  12/08/2023   Left carpal tunnel syndrome 12/08/2023   Dental decay 07/21/2018   Sialoadenitis of submandibular gland 07/21/2018   External hemorrhoid 05/13/2018   Elevated BP without diagnosis of hypertension    Fatigue 11/19/2017   Morbid obesity (HCC) 06/10/2017   Essential hypertension 06/10/2017   Osteoarthritis of right knee 05/26/2017   Chalazion of right eye 12/07/2013   Left shoulder pain 12/07/2013   Environmental allergies 12/07/2013   Past Medical History:  Diagnosis Date   Elevated BP without diagnosis of hypertension    GERD (gastroesophageal reflux disease)    Obesity     Family History  Problem Relation Age of Onset   Stroke Mother        cause of death   Hypertension Mother    Cancer Maternal Aunt        Breast   Pneumonia Father    Hyperlipidemia Sister    Depression Sister    Depression Sister    Colitis Sister    Pneumonia Brother     Past Surgical History:  Procedure Laterality Date   Arthroscopic Knee Surgery Right 2018   Repair of ligament or tendon damage from work related injury   CESAREAN SECTION  1990   TUBAL LIGATION  1993   Social History   Occupational History   Occupation: Housewife  Tobacco Use   Smoking status: Never   Smokeless tobacco: Never  Vaping Use   Vaping status: Never Used  Substance and Sexual Activity   Alcohol use: No   Drug use: No   Sexual activity: Not Currently

## 2024-02-10 ENCOUNTER — Other Ambulatory Visit: Payer: Self-pay

## 2024-02-10 ENCOUNTER — Other Ambulatory Visit: Payer: Self-pay | Admitting: Physician Assistant

## 2024-02-10 MED ORDER — ONDANSETRON HCL 4 MG PO TABS
4.0000 mg | ORAL_TABLET | Freq: Three times a day (TID) | ORAL | 0 refills | Status: AC | PRN
  Filled 2024-02-10: qty 40, 14d supply, fill #0

## 2024-02-10 MED ORDER — HYDROCODONE-ACETAMINOPHEN 5-325 MG PO TABS
1.0000 | ORAL_TABLET | Freq: Three times a day (TID) | ORAL | 0 refills | Status: AC | PRN
  Filled 2024-02-10 – 2024-03-28 (×2): qty 15, 5d supply, fill #0

## 2024-02-11 ENCOUNTER — Encounter (HOSPITAL_BASED_OUTPATIENT_CLINIC_OR_DEPARTMENT_OTHER): Payer: Self-pay | Admitting: Orthopaedic Surgery

## 2024-02-11 NOTE — Progress Notes (Signed)
 Pt stated on PAT phone call she does not have transportation for Dos. Pt aware of requirement and is going to work on it otherwise she will postpone

## 2024-02-17 ENCOUNTER — Encounter (HOSPITAL_BASED_OUTPATIENT_CLINIC_OR_DEPARTMENT_OTHER): Payer: Self-pay

## 2024-02-17 ENCOUNTER — Ambulatory Visit (HOSPITAL_BASED_OUTPATIENT_CLINIC_OR_DEPARTMENT_OTHER): Admission: RE | Admit: 2024-02-17 | Source: Home / Self Care | Admitting: Orthopaedic Surgery

## 2024-02-17 ENCOUNTER — Other Ambulatory Visit: Payer: Self-pay | Admitting: Physician Assistant

## 2024-02-17 ENCOUNTER — Other Ambulatory Visit: Payer: Self-pay

## 2024-02-17 DIAGNOSIS — Z01818 Encounter for other preprocedural examination: Secondary | ICD-10-CM

## 2024-02-17 HISTORY — DX: Hypothyroidism, unspecified: E03.9

## 2024-02-17 SURGERY — CARPAL TUNNEL RELEASE
Anesthesia: Monitor Anesthesia Care | Site: Wrist | Laterality: Right

## 2024-02-17 MED ORDER — MIDAZOLAM HCL 2 MG/2ML IJ SOLN
INTRAMUSCULAR | Status: AC
  Filled 2024-02-17: qty 2

## 2024-02-17 MED ORDER — ONDANSETRON HCL 4 MG/2ML IJ SOLN
INTRAMUSCULAR | Status: AC
  Filled 2024-02-17: qty 2

## 2024-02-17 MED ORDER — FENTANYL CITRATE (PF) 100 MCG/2ML IJ SOLN
INTRAMUSCULAR | Status: AC
  Filled 2024-02-17: qty 2

## 2024-02-17 NOTE — Anesthesia Preprocedure Evaluation (Signed)
 Anesthesia Evaluation    Reviewed: H&P , Patient's Chart, lab work & pertinent test results  Airway        Dental   Pulmonary neg pulmonary ROS          Cardiovascular hypertension,      Neuro/Psych negative neurological ROS  negative psych ROS   GI/Hepatic Neg liver ROS,GERD  ,,  Endo/Other  Hypothyroidism    Renal/GU negative Renal ROS  negative genitourinary   Musculoskeletal negative musculoskeletal ROS (+)    Abdominal   Peds negative pediatric ROS (+)  Hematology negative hematology ROS (+)   Anesthesia Other Findings   Reproductive/Obstetrics negative OB ROS                              Anesthesia Physical Anesthesia Plan  ASA: 2  Anesthesia Plan: MAC   Post-op Pain Management:    Induction: Intravenous  PONV Risk Score and Plan: 2 and Propofol infusion and Treatment may vary due to age or medical condition  Airway Management Planned: Natural Airway  Additional Equipment:   Intra-op Plan:   Post-operative Plan:   Informed Consent: I have reviewed the patients History and Physical, chart, labs and discussed the procedure including the risks, benefits and alternatives for the proposed anesthesia with the patient or authorized representative who has indicated his/her understanding and acceptance.     Dental advisory given  Plan Discussed with: CRNA  Anesthesia Plan Comments:         Anesthesia Quick Evaluation

## 2024-02-19 ENCOUNTER — Other Ambulatory Visit: Payer: Self-pay

## 2024-02-23 ENCOUNTER — Encounter (HOSPITAL_BASED_OUTPATIENT_CLINIC_OR_DEPARTMENT_OTHER): Payer: Self-pay | Admitting: Obstetrics & Gynecology

## 2024-02-23 ENCOUNTER — Ambulatory Visit (HOSPITAL_BASED_OUTPATIENT_CLINIC_OR_DEPARTMENT_OTHER): Admitting: Obstetrics & Gynecology

## 2024-02-23 ENCOUNTER — Other Ambulatory Visit (HOSPITAL_COMMUNITY)
Admission: RE | Admit: 2024-02-23 | Discharge: 2024-02-23 | Disposition: A | Discharge status: Home / Self Care | Source: Ambulatory Visit | Attending: Obstetrics & Gynecology | Admitting: Obstetrics & Gynecology

## 2024-02-23 VITALS — BP 135/92 | HR 69 | Wt 183.6 lb

## 2024-02-23 DIAGNOSIS — Z789 Other specified health status: Secondary | ICD-10-CM

## 2024-02-23 DIAGNOSIS — Z124 Encounter for screening for malignant neoplasm of cervix: Secondary | ICD-10-CM

## 2024-02-23 DIAGNOSIS — N816 Rectocele: Secondary | ICD-10-CM

## 2024-02-23 DIAGNOSIS — N8111 Cystocele, midline: Secondary | ICD-10-CM

## 2024-02-23 DIAGNOSIS — Z98891 History of uterine scar from previous surgery: Secondary | ICD-10-CM

## 2024-02-23 DIAGNOSIS — Z6834 Body mass index (BMI) 34.0-34.9, adult: Secondary | ICD-10-CM

## 2024-02-23 NOTE — Patient Instructions (Signed)
 [facility] Address: [address] [place], [place] [address] Phone: [phone]

## 2024-02-23 NOTE — Progress Notes (Unsigned)
   GYNECOLOGY  VISIT  CC:   vaginal pressure, urinary leakage with sneezing, coughing   HPI: 63 y.o. [id number] Legally Separate Spanish speaking women here for several concerns that relate to urinary incontinence.  She's been having issues for several years but worsening.  The biggest issue is with urinary incontinence with sneezing, laughing or coughing.  She also has urinary urgency.    Her third pregnancy was a large baby weight about 8 1/2 pounds.  She reports the delivery was very hard.    No LMP recorded (lmp unknown). Patient is postmenopausal.  LMP was in her early 21's.    Last MMG:  06/2023 Colonoscopy:  has been referred but has not had done.    Past Medical History:  Diagnosis Date  . Elevated BP without diagnosis of hypertension   . GERD (gastroesophageal reflux disease)   . Hypothyroidism   . Obesity     MEDS:  Reviewed in EPIC  ALLERGIES: Patient has no known allergies.  SH:  separated, non smoker.  ROS  PHYSICAL EXAMINATION:    BP (!) 135/92 (BP Location: Left Arm, Patient Position: Sitting, Cuff Size: Large)   Pulse 69   Wt 183 lb 9.6 oz (83.3 kg)   LMP  (LMP Unknown)   SpO2 100%   BMI 34.69 kg/m     General appearance: alert, cooperative and appears stated age Neck: no adenopathy, supple, symmetrical, trachea midline and thyroid  {CHL AMB PHY EX THYROID  NORM [phone]::normal to inspection and palpation} CV:  {Exam; heart brief:31539} Lungs:  {pe lungs ob:314451} Breasts: {Exam; breast:13139::normal appearance, no masses or tenderness} Abdomen: soft, non-tender; bowel sounds normal; no masses,  no organomegaly Lymph:  no inguinal LAD noted  Pelvic: External genitalia:  no lesions              Urethra:  normal appearing urethra with no masses, tenderness or lesions              Bartholins and Skenes: normal                 Vagina: {exam; pelvic vaginal:30846}              Cervix: {CHL AMB PHY EX CERVIX NORM [phone]::no  lesions}              Bimanual Exam:  Uterus:  {CHL AMB PHY EX UTERUS NORM DEFAULT:[phone]::normal size, contour, position, consistency, mobility, non-tender}              Adnexa: {CHL AMB PHY EX ADNEXA NO MASS DEFAULT:[phone]::no mass, fullness, tenderness}              Rectovaginal: {yes no:314532}.  Confirms.              Anus:  normal sphincter tone, no lesions  Chaperone was present for exam.  Assessment/Plan: There are no diagnoses linked to this encounter.  Vanessa Treen

## 2024-02-24 ENCOUNTER — Encounter: Admitting: Physician Assistant

## 2024-02-25 DIAGNOSIS — N8111 Cystocele, midline: Secondary | ICD-10-CM | POA: Insufficient documentation

## 2024-02-25 DIAGNOSIS — Z789 Other specified health status: Secondary | ICD-10-CM | POA: Insufficient documentation

## 2024-02-25 DIAGNOSIS — Z98891 History of uterine scar from previous surgery: Secondary | ICD-10-CM | POA: Insufficient documentation

## 2024-02-25 DIAGNOSIS — N816 Rectocele: Secondary | ICD-10-CM | POA: Insufficient documentation

## 2024-02-25 LAB — CYTOLOGY - PAP
Comment: NEGATIVE
Diagnosis: NEGATIVE
High risk HPV: NEGATIVE

## 2024-03-02 ENCOUNTER — Ambulatory Visit (HOSPITAL_BASED_OUTPATIENT_CLINIC_OR_DEPARTMENT_OTHER): Payer: Self-pay | Admitting: Obstetrics & Gynecology

## 2024-03-28 ENCOUNTER — Other Ambulatory Visit: Payer: Self-pay
